# Patient Record
Sex: Male | Born: 1965 | Race: White | Hispanic: No | Marital: Single | Smoking: Never smoker
Health system: Southern US, Community
[De-identification: ages and names within clinical notes are randomized; demographics above are authoritative.]

## PROBLEM LIST (undated history)

## (undated) DIAGNOSIS — G4730 Sleep apnea, unspecified: Secondary | ICD-10-CM

## (undated) DIAGNOSIS — K219 Gastro-esophageal reflux disease without esophagitis: Secondary | ICD-10-CM

## (undated) HISTORY — DX: Sleep apnea, unspecified: G47.30

## (undated) HISTORY — DX: Gastro-esophageal reflux disease without esophagitis: K21.9

---

## 2004-08-06 ENCOUNTER — Ambulatory Visit: Payer: Self-pay | Admitting: Internal Medicine

## 2004-10-28 ENCOUNTER — Ambulatory Visit: Payer: Self-pay | Admitting: Internal Medicine

## 2005-08-06 ENCOUNTER — Ambulatory Visit: Payer: Self-pay | Admitting: Internal Medicine

## 2005-08-07 ENCOUNTER — Ambulatory Visit: Payer: Self-pay | Admitting: Internal Medicine

## 2005-08-12 ENCOUNTER — Encounter: Payer: Self-pay | Admitting: Internal Medicine

## 2005-08-12 ENCOUNTER — Ambulatory Visit (HOSPITAL_BASED_OUTPATIENT_CLINIC_OR_DEPARTMENT_OTHER): Admission: RE | Admit: 2005-08-12 | Discharge: 2005-08-12 | Payer: Self-pay | Admitting: Internal Medicine

## 2005-08-16 ENCOUNTER — Ambulatory Visit: Payer: Self-pay | Admitting: Internal Medicine

## 2005-09-25 ENCOUNTER — Ambulatory Visit: Payer: Self-pay | Admitting: Internal Medicine

## 2005-11-06 ENCOUNTER — Ambulatory Visit: Payer: Self-pay | Admitting: Internal Medicine

## 2005-12-18 ENCOUNTER — Ambulatory Visit: Payer: Self-pay | Admitting: Internal Medicine

## 2006-04-09 ENCOUNTER — Ambulatory Visit: Payer: Self-pay | Admitting: Internal Medicine

## 2006-08-06 ENCOUNTER — Ambulatory Visit: Payer: Self-pay | Admitting: Internal Medicine

## 2006-10-14 ENCOUNTER — Ambulatory Visit: Payer: Self-pay | Admitting: Internal Medicine

## 2006-11-29 ENCOUNTER — Ambulatory Visit: Payer: Self-pay | Admitting: Internal Medicine

## 2007-01-11 ENCOUNTER — Ambulatory Visit: Payer: Self-pay | Admitting: Pulmonary Disease

## 2007-06-06 ENCOUNTER — Ambulatory Visit: Payer: Self-pay | Admitting: Internal Medicine

## 2007-11-30 ENCOUNTER — Encounter: Payer: Self-pay | Admitting: Internal Medicine

## 2007-12-02 DIAGNOSIS — J302 Other seasonal allergic rhinitis: Secondary | ICD-10-CM

## 2007-12-02 DIAGNOSIS — G4733 Obstructive sleep apnea (adult) (pediatric): Secondary | ICD-10-CM | POA: Insufficient documentation

## 2007-12-02 DIAGNOSIS — J4520 Mild intermittent asthma, uncomplicated: Secondary | ICD-10-CM

## 2007-12-02 DIAGNOSIS — J3089 Other allergic rhinitis: Secondary | ICD-10-CM

## 2007-12-05 ENCOUNTER — Ambulatory Visit: Payer: Self-pay | Admitting: Internal Medicine

## 2008-06-04 ENCOUNTER — Ambulatory Visit: Payer: Self-pay | Admitting: Pulmonary Disease

## 2008-09-03 ENCOUNTER — Ambulatory Visit: Payer: Self-pay | Admitting: Internal Medicine

## 2008-12-03 ENCOUNTER — Ambulatory Visit: Payer: Self-pay | Admitting: Internal Medicine

## 2009-04-26 ENCOUNTER — Ambulatory Visit: Payer: Self-pay | Admitting: Internal Medicine

## 2009-06-10 ENCOUNTER — Ambulatory Visit: Payer: Self-pay | Admitting: Internal Medicine

## 2009-12-01 ENCOUNTER — Encounter: Payer: Self-pay | Admitting: Internal Medicine

## 2009-12-02 ENCOUNTER — Ambulatory Visit: Payer: Self-pay | Admitting: Internal Medicine

## 2009-12-02 DIAGNOSIS — K219 Gastro-esophageal reflux disease without esophagitis: Secondary | ICD-10-CM

## 2010-01-29 ENCOUNTER — Telehealth: Payer: Self-pay | Admitting: Internal Medicine

## 2010-03-18 ENCOUNTER — Encounter: Payer: Self-pay | Admitting: Internal Medicine

## 2010-05-17 ENCOUNTER — Encounter: Payer: Self-pay | Admitting: Internal Medicine

## 2010-05-29 ENCOUNTER — Encounter: Payer: Self-pay | Admitting: Internal Medicine

## 2010-06-02 ENCOUNTER — Telehealth: Payer: Self-pay | Admitting: Internal Medicine

## 2010-06-09 ENCOUNTER — Ambulatory Visit: Payer: Self-pay | Admitting: Internal Medicine

## 2010-08-26 NOTE — Assessment & Plan Note (Signed)
Summary: rov 6 months///kp   Primary Provider/Referring Provider:  Egbert Garibaldi  CC:  6 month follow up visit-sleep; no trouble with machine.blowing nose more since pressure increased.[name] [name]  History of Present Illness: 06/04/08-OSA, Asthma, Allergic rhinitis-  VPAP is at 15. Doing well. Needs new gasket for his mask. Vaccine ok. Dislikes Advair diskus and wants to try it in MDI form. Discussed and revised med list. No recent wheeze. Denies chest pain, purulent. Sleeping well, but Trazodone 50 isn't enough.  12/03/08- OSA, asthma, allergic rhinitis Continues VPAP at 15. He never turned in card. Not told he snores through it much. Denies daytime somnolence. May need knee surgery- discussed use of VPAP for surgery. Allergy - few sneezes but allergy and asthma have been well controlled. No problems with allergy vaccine but admits missing shots. We discussed compliance. discussed refill Trazodone which he takes on nights before work.  June 10, 2009- OSA, asthma, allergic rhinits Sleep- He feels he is doing well, fully compliant with his VPAP at 15, but told that he snores through at times. He attributes some daytime sleepiness to nights when he choses to stay up til 1AM when he gets up at 530AM. Trying to reduce caffeine. We discussed sleep habit and agreed to raise PAP. Allergy/ Asthma- Admits to missing allergy shots and has taken " a few" since here in May, without reaction to the shots. 15 minute allergy vaccine discusion. No asthma in over 2 years.  Dec 02, 2009- OSA, asthma, allergic rhinitis He fought off a cold 6 weeks ago with no exacerbation of his asthma.  Spring pollen  noted only for a few sneezes when outside. He continues doing well with allergy vaccine at 1:10, once monthly. He blames headache this morning on cutting down fairly heavy caffeine intake. Trazodone taken occasionally as a sleep aid.  OSA is controlled, still using his VPAP machine raised to 16/ 16. Since we raised the  pressure he has had to blow his nose frequently. He manages the humidifer ok and denies epistaxis. Girl friend tells him he snores through the VPAP.  Had barium swallow for substernal pain, told small  hiatal hernia. Omeprazole was recommended but he hasn't done it.    Current Medications (verified): 1)  Allergy Vaccine 1:10 G.o. (W-E) .... 1/ Month 2)  Vpap 16/   Drs 3)  Trazodone Hcl 50 Mg  Tabs (Trazodone Hcl) .[name] [name].. 1 To 2 For Sleep As Needed 4)  Hydrochlorothiazide 25 Mg  Tabs (Hydrochlorothiazide) .... Take 1 By Mouth Once Daily 5)  Qc Aspirin 325 Mg Tabs (Aspirin) .... Take 1 Tablet By Mouth Once A Day 6)  Simvastatin 20 Mg Tabs (Simvastatin) .... Take 1 Tablet By Mouth Once A Day 7)  Coreg 6.25 Mg Tabs (Carvedilol) .... Take 1 Tablet Two Times A Day By Mouth  Allergies (verified): No Known Drug Allergies  Past History:  Past Surgical History: Last updated: 12/05/2007 none  Family History: Last updated: 12/03/2008 Parents living- 2010 Mother- hx asthma  Social History: Last updated: 06/10/2009 Patient never smoked. 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    Vital Signs:  Patient profile:   45 year old male Height:      68 inches Weight:      340 pounds BMI:     51.88 O2 Sat:      97 % on Room air Pulse rate:   62 / minute BP sitting:   118 / 60  (left arm) Cuff size:   large  Vitals Entered By: Reynaldo Minium CMA (Dec 02, 2009 9:21  AM)  O2 Flow:  Room air  Physical Exam  Additional Exam:  General: A/Ox3; pleasant and cooperative, NAD, obese SKIN: no rash, lesions NODES: no lymphadenopathy HEENT: [place]/AT, EOM- WNL, Conjuctivae- clear, PERRLA, TM-WNL, Nose- clear, Throat- clear and wnl- Meallampati -II-III, residual tonsils NECK: Supple w/ fair ROM, JVD- none, normal carotid impulses w/o bruits Thyroid- CHEST: Clear to P&A, good airflow HEART: RRR, no m/g/r heard ABDOMEN: very obese ZOX:WRUE, nl pulses, no edema  NEURO: Grossly intact to observation      Impression & Recommendations:  Problem # 1:  SLEEP APNEA (ICD-780.57)  He can't tell that raising pressure 15 to 16 helped his apnea, but it is less comfortable with mask and facial pressure. He also worries it might be causing bluured vision, We will drop back to 15 so he can compare.  Problem # 2:  GERD (ICD-530.81)  I asked him to elevate the head of his bed, which may also help  VPAP control of his apnea.  Medications Added to Medication List This Visit: 1)  Vpap 15, Advanced  2)  Vpap 15/15 [facility]   Other Orders: Est. Patient Level III (45409) DME Referral (DME)  Patient Instructions: 1)  Please schedule a follow-up appointment in 6 months. 2)  We will contact [facility]  to have pressure reduced to 15/ 15. If this is not comfortable please let us know. 3)  Elevate the head of your bedframe with a brick under each head leg. See if this helps with reflux. You can use an acid blockefr like omeprazole if needed.

## 2010-08-26 NOTE — Assessment & Plan Note (Signed)
Summary: 6 months/apc   Primary Provider/Referring Provider:  Egbert Garibaldi  CC:  6 month follow up visit=allergies-no complaints at this time. and Hypertension Management.  History of Present Illness:  June 10, 2009- OSA, asthma, allergic rhinits Sleep- He feels he is doing well, fully compliant with his VPAP at 15, but told that he snores through at times. He attributes some daytime sleepiness to nights when he choses to stay up til 1AM when he gets up at 530AM. Trying to reduce caffeine. We discussed sleep habit and agreed to raise PAP. Allergy/ Asthma- Admits to missing allergy shots and has taken " a few" since here in May, without reaction to the shots. 15 minute allergy vaccine discusion. No asthma in over 2 years.  Dec 02, 2009- OSA, asthma, allergic rhinitis He fought off a cold 6 weeks ago with no exacerbation of his asthma.  Spring pollen  noted only for a few sneezes when outside. He continues doing well with allergy vaccine at 1:10, once monthly. He blames headache this morning on cutting down fairly heavy caffeine intake. Trazodone taken occasionally as a sleep aid.  OSA is controlled, still using his VPAP machine raised to 16/ 16. Since we raised the pressure he has had to blow his nose frequently. He manages the humidifer ok and denies epistaxis. Girl friend tells him he snores through the VPAP.  Had barium swallow for substernal pain, told small  hiatal hernia. Omeprazole was recommended but he hasn't done it.   June 09, 2010- OSA, asthma, allergic rhinitis Nurse-CC: 6 month follow up visit=allergies-no complaints at this time. Given eyedrops for macular edema. He does use his VPAP machine at 15/15. He wonders if the mask pushing on his face contributes to his eye problems . We discussed alternative masks, alternatives to VPAP. He isn't interested in any surgery. He wears mask tight. Good compliance check recently w/ good control.  Allergy vaccine continues w/o  problems.     Hypertension History:      Positive major cardiovascular risk factors include male age 42 years old or older.  Negative major cardiovascular risk factors include non-tobacco-user status.     Preventive Screening-Counseling & Management  Alcohol-Tobacco     Smoking Status: never  Current Medications (verified): 1)  Allergy Vaccine 1:10 G.o. (W-E) .... 1/ Month 2)  Vpap 15/15    [facility] 3)  Trazodone Hcl 50 Mg  Tabs (Trazodone Hcl) .Marland Kitchen.. 1 To 2 For Sleep As Needed 4)  Hydrochlorothiazide 25 Mg  Tabs (Hydrochlorothiazide) .... Take 1 By Mouth Once Daily 5)  Qc Aspirin 325 Mg Tabs (Aspirin) .... Take 1 Tablet By Mouth Once A Day 6)  Simvastatin 20 Mg Tabs (Simvastatin) .... Take 1 Tablet By Mouth Once A Day 7)  Coreg 6.25 Mg Tabs (Carvedilol) .... Take 1 Tablet Two Times A Day By Mouth 8)  Naproxen 250 Mg Tabs (Naproxen) .... 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  Risk Factors: Smoking Status: never (06/09/2010)  Vital Signs:  Patient profile:   45 year old male Height:      68 inches Weight:  344.13 pounds BMI:     52.51 O2 Sat:      97 % on Room air Pulse rate:   68 / minute BP sitting:   124 / 62  (left arm) Cuff size:   large  Vitals Entered By: Reynaldo Minium CMA (June 09, 2010 9:11 AM)  O2 Flow:  Room air CC: 6 month follow up visit=allergies-no complaints at this time., Hypertension Management   Physical Exam  Additional Exam:  General: A/Ox3; pleasant and cooperative, NAD, obese SKIN: no rash,  lesions NODES: no lymphadenopathy HEENT: [place]/AT, EOM- WNL, Conjuctivae- clear, PERRLA, TM-WNL, Nose- clear, Throat- clear and wnl- Mallampati -II-III, residual tonsils NECK: Supple w/ fair ROM, JVD- none, normal carotid impulses w/o bruits Thyroid- CHEST: Clear to P&A, good airflow HEART: RRR, no m/g/r heard ABDOMEN: very obese ZOX:WRUE, nl pulses, no edema  NEURO: Grossly intact to observation      Impression & Recommendations:  Problem # 1:  SLEEP APNEA (ICD-780.57)  His compliance is good. I don't know that his mask has anything to do with his macular edema, but the pressures might.  We will turn his pressures downt to 14. We will give script for replacement mask so he can take it to different DME companies to compare mask models. We will get him across to the Sleep Center daytime staff to get help with CPAP comfort issues.   Problem # 2:  ALLERGIC RHINITIS (ICD-477.9) adequate control for the season.   Medications Added to Medication List This Visit: 1)  Replacement Cpap Mask of Choice and Supplies  .... Dx osa 780.57 2)  Naproxen 250 Mg Tabs (Naproxen) .... Use as directed per bottle as needed  Other Orders: Est. Patient Level IV (45409) DME Referral (DME)  Hypertension Assessment/Plan:      The patient's hypertensive risk group is category B: At least one risk factor (excluding diabetes) with no target organ damage.  Today's blood pressure is 124/62.     Patient Instructions: 1)  Please schedule a follow-up appointment in 6 months. 2)  We will try reducing VPAP pressures to 14/14 to see if that takes any fluid pressure off your eye. 3)  See [facility] for names and addresses of some other DME companies that might have different mask designs. She can also get you over to see the [facility] staff for help with mask fit and comfort.  4)  ...................................................................................................... 5)  ii/15/11- He did go to [facility]  and mask adjusted to pressure 14 CPAP w/ medium Phillips Comfort full face mask

## 2010-08-26 NOTE — Progress Notes (Signed)
Summary: CPAP/ VPAP download good on current settings.   Phone Note Other Incoming   Summary of Call: [facility]- Complaince donwload on VPAP insp 15/ exp 15- good compliance and control . No change needed.  Initial call taken by: Waymon Budge MD,  June 02, 2010 5:30 PM

## 2010-08-26 NOTE — Letter (Signed)
Summary: CMN for Replmnt full face cush/[facility]  CMN for Replmnt full face cush/[facility]   Imported By: Sherian Rein 05/26/2010 13:46:13  _____________________________________________________________________  External Attachment:    Type:   Image     Comment:   External Document

## 2010-08-26 NOTE — Letter (Signed)
Summary: CPAP Supplies/[facility]  CPAP Supplies/[facility]   Imported By: Sherian Rein 03/21/2010 11:49:33  _____________________________________________________________________  External Attachment:    Type:   Image     Comment:   External Document

## 2010-08-26 NOTE — Progress Notes (Signed)
Summary: order  Phone Note Call from Patient Call back at Home Phone [phone]   Caller: Patient Call For: youing Reason for Call: Talk to Nurse Summary of Call: pt switching DME companies.  Need order and sleep study sent to [facility] in [place].  [phone] Fax# Initial call taken by: Eugene Gavia,  January 29, 2010 4:13 PM  Follow-up for Phone Call        called spoke with patient who states he was supposed to have been changed from drs to [facility] in [place] at last ov in may, but this has not been done.  i see an order in patient's chart where vpap pressure was sent to [facility]; they will also need a copy of his sleep study and recent download per pt.  will send to [facility] Boone Master CNA/MA  January 29, 2010 4:20 PM   Called and spoke with [facility] in [place]. Order was faxed to them in May along with sleep study and Beth stated that she did not recv. order. Refaxed order to  [phone] with sleep study and asked that they process ASAP. Rhonda Cobb  January 30, 2010 11:25 AM Contacted pt and advised pt that order and sleep study has been re-faxed to [facility] in [place]. Asked that [facility] call pt today. Pt advised that if he does not hear from them in a week to call us back. Pt stated that he would. Rhonda Cobb  January 30, 2010 11:39 AM

## 2010-11-26 ENCOUNTER — Encounter: Payer: Self-pay | Admitting: Internal Medicine

## 2010-12-01 ENCOUNTER — Ambulatory Visit (INDEPENDENT_AMBULATORY_CARE_PROVIDER_SITE_OTHER): Payer: Federal, State, Local not specified - PPO | Admitting: Internal Medicine

## 2010-12-01 ENCOUNTER — Encounter: Payer: Self-pay | Admitting: Internal Medicine

## 2010-12-01 VITALS — BP 140/78 | HR 73 | Ht 68.0 in | Wt 339.8 lb

## 2010-12-01 DIAGNOSIS — J45909 Unspecified asthma, uncomplicated: Secondary | ICD-10-CM

## 2010-12-01 DIAGNOSIS — G4730 Sleep apnea, unspecified: Secondary | ICD-10-CM

## 2010-12-01 DIAGNOSIS — J309 Allergic rhinitis, unspecified: Secondary | ICD-10-CM

## 2010-12-01 MED ORDER — TRAZODONE HCL 50 MG PO TABS: 50.0000 mg | ORAL_TABLET | Freq: Every day | ORAL | Status: DC

## 2010-12-01 NOTE — Progress Notes (Signed)
  Subjective:    Patient ID: [name] [name], male    DOB: 1966-04-26, 45 y.o.   MRN: [id number]  HPI 12/01/10-45 yoM followed for sleep apnea and allergic rhinitis, complicated by morbid obesity and hx of GERD.  Last here June 09, 2010- note reviewed.  He did well through the winter. He got new cat, and has been outside on porch with old carpet, playing with the cat. In last 2 days has had more nasal congestion and drainage.  Has zyrtec but hadn't taken it. Denies cough, wheeze, chest tightness.  Continues allergy vaccine.  Continues VPAP at 15/15, able to use it all night, every night. Has not lost weight.  Review of Systems See HPI Constitutional:   No weight loss, night sweats,  Fevers, chills, fatigue, lassitude. HEENT:   No headaches,  Difficulty swallowing,  Tooth/dental problems,  Sore throat,                CV:  No chest pain,  Orthopnea, PND, swelling in lower extremities, anasarca, dizziness, palpitations  GI  No heartburn, indigestion, abdominal pain, nausea, vomiting, diarrhea, change in bowel habits, loss of appetite  Resp: No excess mucus, no productive cough,  No non-productive cough,  No coughing up of blood.  No change in color of mucus.  No wheezing.  No chest wall deformity  Skin: no rash or lesions.  GU: no dysuria, change in color of urine, no urgency or frequency.  No flank pain.  MS:  No joint pain or swelling.  No decreased range of motion.  No back pain.  Psych:  No change in mood or affect. No depression or anxiety.  No memory loss.      Objective:   Physical Exam General- Alert, Oriented, Affect-appropriate, Distress- none acute     Morbidly obese  Skin- rash-none, lesions- none, excoriation- none  Lymphadenopathy- none  Head- atraumatic  Eyes- Gross vision intact, PERRLA, conjunctivae clear secretions  Ears- Normal - Hearing, canals, Tm  Nose- Clear,  No- Septal dev, mucus, polyps, erosion, perforation   Throat- Mallampati IV , mucosa clear ,  drainage- none, tonsils- atrophic  Neck- flexible , trachea midline, no stridor , thyroid nl, carotid no bruit  Chest - symmetrical excursion , unlabored     Heart/CV- RRR , no murmur , no gallop  , no rub, nl s1 s2                     - JVD- none , edema- none, stasis changes- none, varices- none     Lung- clear to P&A, wheeze- none, cough- none , dullness-none, rub- none     Chest wall-  Abd- tender-no, distended-no, bowel sounds-present, HSM- no  Br/ Gen/ Rectal- Not done, not indicated  Extrem- cyanosis- none, clubbing, none, atrophy- none, strength- nl  Neuro- grossly intact to observation         Assessment & Plan:

## 2010-12-01 NOTE — Patient Instructions (Signed)
Continue present treatments as discussed. Please call if needed.

## 2010-12-01 NOTE — Assessment & Plan Note (Signed)
Multiple recent exposures as discussed. No acute intervention available that would make sense for him now. Med education done.

## 2010-12-01 NOTE — Assessment & Plan Note (Signed)
He met with sleep center day-time staff, without mask change. He is now able to use VPAP every night and compliance is very much better.  He is well aware that weight loss would help.[name] [name]

## 2010-12-07 ENCOUNTER — Encounter: Payer: Self-pay | Admitting: Internal Medicine

## 2010-12-07 NOTE — Assessment & Plan Note (Signed)
No recent exacerbation 

## 2010-12-09 NOTE — Assessment & Plan Note (Signed)
[facility]                             PULMONARY OFFICE NOTE   NAME:[name] [name], [name] [name]                          MRN:          [id number]  DATE:01/11/2007                            DOB:          April 01, 1966    HISTORY OF PRESENT ILLNESS:  This is a 45 year old white male patient of  Dr. Roxy Cedar who has a known history of asthma, allergic rhinitis,  obstructive sleep apnea and morbid obesity, presents today for an acute  office visit.  The patient claims over the last 2 weeks he has had  increased nasal congestion, cough, postnasal drip and wheezing.  The  patient denies any purulent sputum, hemoptysis, orthopnea, PND or  increased leg swelling.  The patient was given ipratropium and nasal  spray and Hydromet cough syrup by his primary care physician and has had  only slight improvement in his symptoms.   PAST MEDICAL HISTORY:  Is reviewed.   CURRENT MEDICATIONS:  Reviewed.   PHYSICAL EXAMINATION:  The patient is a pleasant male in no acute  distress.  He is afebrile with stable vital signs.  O2 saturation is 96% on room  air.  HEENT:  Nasal mucosa is pale.  Nontender sinuses.  Posterior pharynx is  clear.  NECK:  Supple without cervical adenopathy.  No JVD.  LUNGS:  Sounds reveal diminished breath sounds without any wheezing or  crackles.  CARDIAC:  Regular rate and rhythm.  ABDOMEN:  Morbidly obese and soft.  EXTREMITIES:  Warm without any cyanosis, or clubbing.  There appears to  be trace to 1+ edema.  Patient has bilateral support stockings on.   IMPRESSION AND PLAN:  Acute upper respiratory infection and rhinitis  flare.  The patient is to begin Mucinex DM twice daily.  Add in Zyrtec  10 mg at bedtime.  Was given his Xopenex nebulizer treatment in the  office.  The patient will follow back up with Dr. Maple Hudson as scheduled or  sooner if needed.  The patient is to contact our office for sooner  followup if symptoms do not improve or  worsen.      Rubye Oaks, NP  Electronically Signed      Clinton D. Maple Hudson, MD, Tonny Bollman, FACP  Electronically Signed   TP/MedQ  DD: 01/11/2007  DT: 01/12/2007  Job #: [id number]

## 2010-12-09 NOTE — Assessment & Plan Note (Signed)
[facility]                             PULMONARY OFFICE NOTE   NAME:[name], [name]                          MRN:          [id number]  DATE:06/06/2007                            DOB:          April 04, 1966    PROBLEM:  1. Asthma.  2. Allergic rhinitis.  3. Obstructive sleep apnea.  4. Exogenous obesity.   HISTORY:  Was seen here for acute flare of asthma with a viral syndrome  in June and subsequently got an Avelox prescription elsewhere because he  was a little anxious about his status just before a trip to [place].  Today he feels well.  We discussed refilling Trazodone which he got  filled in January and uses only occasionally for sleep.  BiPAP is set at  inspiratory 13 and expiratory 11.  Sometimes he feels that it is not  quite high enough when he is lying on his back with a sense of closing  off at the lower throat.  He has been inconsistent with his allergy  vaccine and we talked through this.  He believes it helps and wants to  stay with it.  There have been no reactions.   MEDICATIONS:  1. Allergy vaccine..  2. BiPAP.  3. Amlodipine 5 mg.  4. P.r.n. use of Advair 250/50.  5. Ipratropium 0.06% nasal spray.  6. Hydromet cough syrup.  7. Trazodone 50 mg one-half or one bedtime p.r.n. sleep.  8. EpiPen.   NO MEDICATION ALLERGY.   OBJECTIVE:  Overweight at 345 pounds, blood pressure 152/80, pulse 76,  room air saturation 98%.  He is alert and calm, nasal airway  unobstructed.  CHEST:  Clear.  HEART:  Sounds normal.   IMPRESSION:  1. Allergic rhinitis, asthma, currently under satisfactory control.      We discussed allergy vaccine.  He chooses to continue and we have      discussed logistics and management to keep him on schedule.  2. Obstructive sleep apnea.  Losing some control because of weight      gain.  He is encouraged      to lose weight and DRS is contacted to raise his biphasic positive      airway pressure to inspiratory 14  and expiratory 11.  Schedule      return 6 months, earlier p.r.n.     Clinton D. Maple Hudson, MD, Tonny Bollman, FACP  Electronically Signed    CDY/MedQ  DD: 06/06/2007  DT: 06/06/2007  Job #: [id number]   cc:   Cheri Rous

## 2010-12-12 NOTE — Procedures (Signed)
NAMEDILLION, STOWERS NO.:  [id number]   MEDICAL RECORD NO.:  [id number]          PATIENT TYPE:  OUT   LOCATION:  SLEEP CENTER                 FACILITY:  [facility]   PHYSICIAN:  Clinton D. Maple Hudson, M.D. DATE OF BIRTH:  November 10, 1965   DATE OF STUDY:  08/12/2005                              NOCTURNAL POLYSOMNOGRAM   REFERRING PHYSICIAN:  Dr. Jetty Duhamel   DATE OF STUDY:  August 12, 2005   INDICATION FOR STUDY:  Hypersomnia with sleep apnea. Epworth sleepiness  score 11/24, BMI 48.8, weight 330 pounds.   SLEEP ARCHITECTURE:  Short total sleep time 221 minutes with sleep  efficiency 56%. Stage I was 26%, stage II 65%, stages III and IV 4%, REM 5%  of total sleep time. Sleep latency 51 minutes, REM latency 288 minutes,  awake after sleep onset 119 minutes, arousal index markedly increased at 83  per hour. No bedtime medication was taken.   RESPIRATORY DATA:  Apnea-hypopnea index (AHI, RDI) 53.9 obstructive events  per hour indicating severe obstructive sleep apnea/hypopnea syndrome. This  included 2 central apneas, 112 obstructive apneas and 85 hypopneas. Events  were not positional. REM AHI 97 per hour. He was unable to maintain sleep  sufficiently to permit use of CPAP titration by split protocol on this study  night.   OXYGEN DATA:  Moderate to severe snoring with oxygen desaturation to a nadir  of 79%. Mean oxygen saturation through the study was 92% on room air.   CARDIAC DATA:  Normal sinus rhythm.   MOVEMENT/PARASOMNIA:  A total of 41 limb jerks were recorded of which 17  were associated with arousal or awakening for a periodic limb movement with  arousal index of 4.6 per hour which is increased. The patient complained of  back pain in the morning. Bathroom x2.   IMPRESSION/RECOMMENDATIONS:  1.  Severe obstructive sleep apnea/hypopnea syndrome, apnea-hypopnea index      53.9 per hour with nonpositional events, moderate to loud snoring and      oxygen  desaturation to 79%.  2.  Restless sleep quality with frequent awakenings attributed at least in      part to the patient's desire to remain sleeping prone. Note complaint of      back pain the next day.  3.  Consider return for continuous positive airway pressure titration or      evaluate for alternative therapies as appropriate.      Clinton D. Maple Hudson, M.D.  Diplomate, Biomedical engineer of Sleep Medicine  Electronically Signed     CDY/MEDQ  D:  08/16/2005 13:28:53  T:  08/17/2005 18:49:52  Job:  [id number]

## 2010-12-12 NOTE — Assessment & Plan Note (Signed)
[facility]                             PULMONARY OFFICE NOTE   NAME:[name], [name]                          MRN:          [id number]  DATE:10/14/2006                            DOB:          [birth year]    PROBLEM:  1. Asthma.  2. Allergic rhinitis.  3. Obstructive sleep apnea.  4. Exogenous obesity.   HISTORY:  He describes an episode of palpitation before sleep about a  week ago, felt as a rapid heartbeat lasting about 3 hours, and self-  limited.  He did not notice whether it began abruptly or gradually, and  there was no chest pain.  He says a doctor friend gave him Azmacort and  Cipro, which had helped some chest congestion until he got near a  burning field yesterday, and now he is coughing more.  He has had no  sore throat, fever, or sinus pain.  He says his appetite is down without  nausea or vomiting.  In mid January he had gone to [facility]  on a weekend, and got a nebulizer treatment.  Apparently, there was  discussion about whether he was mostly anxious.  He is using his BiPAP  routinely now, and seems to be better settled in with that.  He  continues allergy vaccine at 1:10 with no problems.  BiPAP is set at  inspiratory 13, expiratory 11.   Also on:  1. Amlodipine 5 mg.  2. He has a sample of Azmacort.  3. He has had some Ambien CR 12.5 mg.   NO MEDICATION ALLERGY.   OBJECTIVE:  Weight was not recorded.  I think he is off our scale now.  BP 132/82.  Pulse 101, regular.  Oxygen saturation 97% at rest.  He is quite obese.  Pulse was regular with no extra beats.  No murmur or gallop.  There is minimal chest congestion bilaterally.  Unlabored.  Legs were heavy, but soft without edema.   IMPRESSION:  1. Bronchitis.  2. Paroxysmal tachy rhythm.   PLAN:  1. Finish the Cipro he was given.  2. Nebulizer treatment here.  Xopenex 1.25 mg.  3. Finish sample of Azmacort at 2 puffs t.i.d.  We want to reevaluate      when he is  off that.  4. Consider a peak flow meter.  5. He is going to keep his May appointment, earlier p.r.n.     Clinton D. Maple Hudson, MD, Tonny Bollman, FACP  Electronically Signed    CDY/MedQ  DD: 10/16/2006  DT: 10/16/2006  Job #: [id number]   cc:   Cheri Rous

## 2010-12-12 NOTE — Assessment & Plan Note (Signed)
[facility]                               PULMONARY OFFICE NOTE   NAME:[name] [name]                          MRN:          [id number]  DATE:04/09/2006                            DOB:          [birth year]    PULMONARY SLEEP FOLLOW-UP   PROBLEM:  1. Asthma.  2. Allergic rhinitis.  3. Obstructive sleep apnea.  4. Exogenous obesity.   HISTORY:  [name] [name] says [name] [name] is comfortable with no problems at all on allergy  vaccine at 1:10, giving his own injections.  We reviewed policy concerning  administration outside of a medical office, anaphylaxis, epinephrine, and  options.  [name] [name] reviewed and signed our waiver, choosing to continue giving his  own vaccine, which [name] [name] has tolerated well.  [name] [name] denies any respiratory  problems this summer or fall so far.  [name] [name] has traveled back and forth to [facility] trying to get his BiPAP comfortable.  [name] [name] had originally been set  at 10/6 and then was auto-titrated up to 19/16 with flex at 3.  Was not  comfortable with that and we adjusted first to 16/14 with a flex at 3.  [name] [name]  now feels the pressure is too high.  We discussed establishing with a home  care company closer to him.  We have again discussed weight loss, which  would help a great deal, and other treatment options available.   MEDICATIONS:  1. Allergy vaccine.  2. BiPAP.  3. Ambien CR 12.5, used occasionally.   NO MEDICATION ALLERGY.   OBJECTIVE:  VITAL SIGNS:  Weight 340 pounds, which is down a little bit.  Bp  142/86, pulse regular 74, room air saturation 90%.  RESPIRATORY:  Breathing is unlabored at rest.  [name] [name] is quite alert.  There are  no pressure marks on his face from the mask, and no evident nasal  congestion, neck vein distension, or stridor.  HEART:  Sounds are regular without murmur.   IMPRESSION:  1. Allergic rhinitis and mild asthma seem well-controlled.  2. Obstructive sleep apnea remains a comfort problem.  The controlling      issue is his  inability to lose weight, and [name] [name] may have actually become      interested in bariatric surgery if other measures do not work.  We will      keep trying to help him get comfortable with BiPAP.   PLAN:  1. DRS is going to reduce his BiPAP to inspiratory 13 and expiratory 11.  2. [name] [name] has a current prescription for an EpiPen to keep on hand, as [name] [name]      continues allergy      vaccine at 1:10.  3. Schedule return in 4 months, earlier p.r.n.                                   Clinton D. Maple Hudson, MD, FCCP, FACP   CDY/MedQ  DD:  04/10/2006  DT:  04/11/2006  Job #:  [id number]   cc:  Cheri Rous

## 2010-12-12 NOTE — Assessment & Plan Note (Signed)
[facility]                             PULMONARY OFFICE NOTE   NAME:[name] [name]                          MRN:          [id number]  DATE:08/06/2006                            DOB:          1965-11-06    PULMONARY/SLEEP FOLLOWUP   PROBLEM:  1. Asthma.  2. Allergic rhinitis.  3. Obstructive sleep apnea.  4. Exogenous obesity.   HISTORY:  He had been very frustrated with BiPAP fit and comfort.  We  gave him an opportunity to switch home care companies but he did not act  on that and stopped using his mask for quite awhile.  Then, in recent  weeks, he has had 2 or 3 episodes where he has felt 1 or 2 distinct  palpitations which he interprets as my heart stopped for not more than  1 or 2 beats at a time with no syncope, chest pain, or distress  otherwise.  He decided this was a sign that he did need to be compliant  with his BiPAP.  He still feels the pressure is a little too high set  with an inspiratory pressure of 13 and an expiratory pressure of 11.  He  had gone with a tourist group to [place] and caught a cold there.  He is  taking an antibiotic, getting over that.  He is able to tolerate nasal  pillows instead of a full-face CPAP mask and apparently this is  acceptable even with the cold.  He is expressing more motivation towards  trying to lose weight and change his lifestyle and I offered support.   MEDICATIONS:  1. Allergy vaccine continues at 1:10 with no problems.  2. BiPAP has been inspiratory 13, expiratory 11.  3. Amlodipine 5 mg.  4. Amoxicillin currently.  5. Ambien CR 12.5 mg for he asks for a refill.   No medication allergy.   OBJECTIVE:  Weight 345 pounds.  BP 142/72.  Pulse 77.  Room air  saturation 96%.  Pulse is quite regular during prolonged palpation by  me.  Heart sounds are normal.  There is no neck vein distention or stridor.  LUNGS:  Clear. No edema.  Nasal airway is clear.  No distress.   IMPRESSION:  Sleep apnea  with persistent difficulty with continuous  positive airway pressure tolerance.  We are going to try a lower  pressure.  Recent palpitations probably are not related to his sleep  apnea.  Exogenous obesity remains the major underlying problem.   PLAN:  1. DRS will reduce his inspiratory pressure to 12, continuing pressure      at 11 for expiration.  2. Weight loss.  3. He will followup with his primary physician about his palpitation.  4. Refill Ambien CR 12.5 mg for occasional p.r.n. use.  5. Schedule return in 4 months, earlier p.r.n.     Clinton D. Maple Hudson, MD, Tonny Bollman, FACP  Electronically Signed    CDY/MedQ  DD: 08/06/2006  DT: 08/06/2006  Job #: [id number]   cc:   Cheri Rous

## 2010-12-12 NOTE — Assessment & Plan Note (Signed)
[facility]                             PULMONARY OFFICE NOTE   NAME:[name] [name], [name] [name]                          MRN:          [id number]  DATE:11/29/2006                            DOB:          1966/01/02    PROBLEM LIST:  1. Asthma.  2. Allergic rhinitis.  3. Obstructive sleep apnea.  4. Exogenous obesity.   HISTORY:  He says pollen made his skin itch when he was mowing the lawn  and he realized he should be wearing a mask. His primary care physician  gave him a prednisone burst and some Benadryl. I am not clear if he just  itched or actually was beginning to get some rash from this but it has  resolved. He did a CPAP download with his home care company and that is  pending their report to me. He is using CPAP now virtually all night  every night and seems to be a lot happier with it.   MEDICATIONS:  1. He continues the allergy vaccine without problems. He has an Epipen      which has not been needed. BiPAP is inspiratory 13, expiratory 11      at last check.  2. Amlodipine 5 mg.  3. Occasional Ambien CR 12.5 mg.   No medication allergies.   OBJECTIVE:  VITAL SIGNS:  Weight 330 pounds which is down a couple.  Blood pressure 130/67, pulse 70, room air saturation 97%.  LUNGS:  Clear.  HEART:  Sounds normal.  There are no pressure marks on his face. His nose is not obstructed.   IMPRESSION:  1. Allergic rhinitis with recent dermatitis by description, now      resolved.  2. Asthma.  3. Obstructive sleep apnea. The best thing that could possibly happen      would be for continued effort at weight loss.   PLAN:  We discussed options. Schedule return in 6 months, earlier p.r.n.    Clinton D. Maple Hudson, MD, Tonny Bollman, FACP  Electronically Signed   CDY/MedQ  DD: 11/29/2006  DT: 11/30/2006  Job #: [id number]   cc:   Cheri Rous

## 2011-06-01 ENCOUNTER — Encounter: Payer: Self-pay | Admitting: Internal Medicine

## 2011-06-01 ENCOUNTER — Ambulatory Visit (INDEPENDENT_AMBULATORY_CARE_PROVIDER_SITE_OTHER): Payer: Federal, State, Local not specified - PPO | Admitting: Internal Medicine

## 2011-06-01 VITALS — BP 108/62 | HR 65 | Ht 68.0 in | Wt 340.0 lb

## 2011-06-01 DIAGNOSIS — J309 Allergic rhinitis, unspecified: Secondary | ICD-10-CM

## 2011-06-01 DIAGNOSIS — G4730 Sleep apnea, unspecified: Secondary | ICD-10-CM

## 2011-06-01 NOTE — Progress Notes (Signed)
Subjective:    Patient ID: [name] [name], male    DOB: 1965/08/27, 45 y.o.   MRN: [id number]  HPI 12/01/10-45 yoM followed for sleep apnea and allergic rhinitis, complicated by morbid obesity and hx of GERD.  Last here June 09, 2010- note reviewed.  He did well through the winter. He got new cat, and has been outside on porch with old carpet, playing with the cat. In last 2 days has had more nasal congestion and drainage.  Has zyrtec but hadn't taken it. Denies cough, wheeze, chest tightness.  Continues allergy vaccine.  Continues VPAP at 15/15, able to use it all night, every night. Has not lost weight.  06/01/11- 45 yoM followed for sleep apnea and allergic rhinitis, complicated by morbid obesity and hx of GERD.  He declines flu vaccine. Doing well. Download documents excellent compliance and control with VPAP at inspiratory pressure 14 and expiratory pressure 14. He has had some problem with the quality and durability of his mask and hose. We talked about alternative suppliers. He got a cat in April. In recent months he has had increased throat clearing and dry cough. These wake him occasionally and are relieved by sips or hard candy. He is not really aware of reflux. We discussed the potential role of lisinopril as an ACE inhibitor.   Review of Systems See HPI Constitutional:   No-   weight loss, night sweats, fevers, chills, fatigue, lassitude. HEENT:   No-  headaches, difficulty swallowing, tooth/dental problems, sore throat,       No-  sneezing, itching, ear ache, nasal congestion, post nasal drip,  CV:  No-   chest pain, orthopnea, PND, swelling in lower extremities, anasarca, dizziness, palpitations Resp: No-   shortness of breath with exertion or at rest.              No-   productive cough,  +non-productive cough,  No- coughing up of blood.              No-   change in color of mucus.  No- wheezing.   Skin: No-   rash or lesions. GI:  No-   heartburn, indigestion, abdominal pain,  nausea, vomiting, diarrhea,                 change in bowel habits, loss of appetite GU: No-   dysuria, change in color of urine, no urgency or frequency.  No- flank pain. MS:  No-   joint pain or swelling.  No- decreased range of motion.  No- back pain. Neuro-     nothing unusual Psych:  No- change in mood or affect. No depression or anxiety.  No memory loss.      Objective:   Physical Exam General- Alert, Oriented, Affect-appropriate, Distress- none acute, morbidly obese Skin- rash-none, lesions- none, excoriation- none Lymphadenopathy- none Head- atraumatic            Eyes- Gross vision intact, PERRLA, conjunctivae clear secretions            Ears- Hearing, canals-normal            Nose- Clear, no-Septal dev, mucus, polyps, erosion, perforation             Throat- Mallampati III-IV , mucosa obscured, drainage- none, tonsils- atrophic Neck- flexible , trachea midline, no stridor , thyroid nl, carotid no bruit Chest - symmetrical excursion , unlabored           Heart/CV- RRR , no murmur , no gallop  ,  no rub, nl s1 s2                           - JVD- none , edema- none, stasis changes- none, varices- none           Lung- clear to P&A, wheeze- none, cough- none , dullness-none, rub- none           Chest wall-  Abd- tender-no, distended-no, bowel sounds-present, HSM- no Br/ Gen/ Rectal- Not done, not indicated Extrem- cyanosis- none, clubbing, none, atrophy- none, strength- nl Neuro- grossly intact to observation

## 2011-06-01 NOTE — Patient Instructions (Signed)
Consider looking at [employer] as an alternative supplier of CPAP supplies  Ask your primary physician if you can either come off Lisinopril, or replace it with something else for a month, to see if your cough is related to the "ACE" class of BP meds. Also watch for any hints that cough is related to postnasal drip or to reflux off stomach juice.

## 2011-06-02 NOTE — Assessment & Plan Note (Signed)
He continues allergy vaccine. He has had a cat now for 7 months and he is responsible for caring for it. We discussed his throat symptoms as either allergic or possibly caused by his ACE inhibitor. He is going to discuss a trial off of lisinopril with his primary physician.

## 2011-06-02 NOTE — Assessment & Plan Note (Signed)
Good compliance and control as of download 06/01/2011. Weight loss would help. Obesity hypoventilation syndrome is not ruled out. We discussed the expectations for durability of his equipment and typical time intervals at which insurers will provide replacement.

## 2011-11-30 ENCOUNTER — Encounter: Payer: Self-pay | Admitting: Internal Medicine

## 2011-11-30 ENCOUNTER — Ambulatory Visit (INDEPENDENT_AMBULATORY_CARE_PROVIDER_SITE_OTHER): Payer: Federal, State, Local not specified - PPO | Admitting: Internal Medicine

## 2011-11-30 VITALS — BP 120/64 | HR 73 | Ht 68.0 in | Wt 347.2 lb

## 2011-11-30 DIAGNOSIS — G4730 Sleep apnea, unspecified: Secondary | ICD-10-CM

## 2011-11-30 DIAGNOSIS — J45909 Unspecified asthma, uncomplicated: Secondary | ICD-10-CM

## 2011-11-30 DIAGNOSIS — J309 Allergic rhinitis, unspecified: Secondary | ICD-10-CM

## 2011-11-30 NOTE — Progress Notes (Signed)
Subjective:    Patient ID: [name] [name], male    DOB: 1966-05-16, 46 y.o.   MRN: [id number]  HPI 12/01/10-45 yoM followed for sleep apnea and allergic rhinitis, complicated by morbid obesity and hx of GERD.  Last here June 09, 2010- note reviewed.  He did well through the winter. He got new cat, and has been outside on porch with old carpet, playing with the cat. In last 2 days has had more nasal congestion and drainage.  Has zyrtec but hadn't taken it. Denies cough, wheeze, chest tightness.  Continues allergy vaccine.  Continues VPAP at 15/15, able to use it all night, every night. Has not lost weight.  06/01/11- 45 yoM followed for sleep apnea and allergic rhinitis, complicated by morbid obesity and hx of GERD.  He declines flu vaccine. Doing well. Download documents excellent compliance and control with VPAP at inspiratory pressure 14 and expiratory pressure 14. He has had some problem with the quality and durability of his mask and hose. We talked about alternative suppliers. He got a cat in April. In recent months he has had increased throat clearing and dry cough. These wake him occasionally and are relieved by sips or hard candy. He is not really aware of reflux. We discussed the potential role of lisinopril as an ACE inhibitor.  11/30/11- 45 yoM followed for sleep apnea and allergic rhinitis, complicated by morbid obesity and hx of GERD. Using VPAP every night for approx5-6 hours at night and pressure doing well; States he would like to speak about allergy vaccine-as his has expired-cant tell any difference since being off vaccne He continues VPAP 14/14/[employer] [facility] with documented good compliance and control. He is concerned that the mask pushes up under his orbits and might possibly worsen his astigmatism. He tried many different masks before settling on this when. We discussed alternatives to CPAP including oral appliances. He drifted off of allergy vaccine again, some months ago.  Was not very uncomfortable through this pollen season. I suggested we stay off vaccine and watch to see how he does. He will use antihistamines.   Review of Systems-See HPI Constitutional:   No-   weight loss, night sweats, fevers, chills, fatigue, lassitude. HEENT:   No-  headaches, difficulty swallowing, tooth/dental problems, sore throat,       No-  sneezing, itching, ear ache, nasal congestion, post nasal drip,  CV:  No-   chest pain, orthopnea, PND, swelling in lower extremities, anasarca, dizziness, palpitations Resp: No-   shortness of breath with exertion or at rest.              No-   productive cough,  +non-productive cough,  No- coughing up of blood.              No-   change in color of mucus.  No- wheezing.   Skin: No-   rash or lesions. GI:  No-   heartburn, indigestion, abdominal pain, nausea, vomiting,  GU:  MS:   Neuro-     nothing unusual Psych:  No- change in mood or affect. No depression or anxiety.  No memory loss.  Objective:   Physical Exam General- Alert, Oriented, Affect-appropriate, Distress- none acute, morbidly obese Skin- rash-none, lesions- none, excoriation- none Lymphadenopathy- none Head- atraumatic            Eyes- Gross vision intact, PERRLA, conjunctivae clear secretions            Ears- Hearing, canals-normal  Nose- Clear, no-Septal dev, mucus, polyps, erosion, perforation             Throat- Mallampati III-IV , mucosa obscured, drainage- none, tonsils- atrophic Neck- flexible , trachea midline, no stridor , thyroid nl, carotid no bruit Chest - symmetrical excursion , unlabored           Heart/CV- RRR , no murmur , no gallop  , no rub, nl s1 s2                           - JVD- none , edema- none, stasis changes- none, varices- none           Lung- clear to P&A, wheeze- none, cough- none , dullness-none, rub- none           Chest wall-  Abd-  Br/ Gen/ Rectal- Not done, not indicated Extrem- cyanosis- none, clubbing, none, atrophy- none,  strength- nl Neuro- grossly intact to observation

## 2011-11-30 NOTE — Patient Instructions (Addendum)
My suggestion, since you have been off allergy vaccine for a while- This may be a good time to stop allergy shots and see how you do off them. I will tell the allergy lab. We can always restart if needed.   As an alternative to VPAP, you can look into oral appliances to treat snoring and sleep apnea.  Suggest you call the office of Dr Althea Grimmer, DDS, Orthodontist in [place], for information on these devices.

## 2011-12-04 NOTE — Assessment & Plan Note (Signed)
Very mild intermittent, not needing medication now.

## 2011-12-04 NOTE — Assessment & Plan Note (Signed)
Good compliance and control with VPAP but he is getting restless and would like to know alternatives. I have again suggested he talk with Dr. Althea Grimmer about oral appliances. Weight loss remains an important issue.

## 2011-12-04 NOTE — Assessment & Plan Note (Signed)
He has drifted off of allergy vaccine again and is not sufficiently compliant to benefit. He has done well this spring. The real test will be over the next year. I am stopping his allergy vaccine now for observation.

## 2012-03-29 ENCOUNTER — Ambulatory Visit (INDEPENDENT_AMBULATORY_CARE_PROVIDER_SITE_OTHER): Payer: Federal, State, Local not specified - PPO | Admitting: Internal Medicine

## 2012-03-29 ENCOUNTER — Encounter: Payer: Self-pay | Admitting: Internal Medicine

## 2012-03-29 VITALS — BP 142/64 | HR 64 | Ht 68.0 in | Wt 344.8 lb

## 2012-03-29 DIAGNOSIS — J309 Allergic rhinitis, unspecified: Secondary | ICD-10-CM

## 2012-03-29 DIAGNOSIS — J45909 Unspecified asthma, uncomplicated: Secondary | ICD-10-CM

## 2012-03-29 DIAGNOSIS — G4730 Sleep apnea, unspecified: Secondary | ICD-10-CM

## 2012-03-29 NOTE — Patient Instructions (Addendum)
I hope your experience is good with the oral appliance for your sleep apnea. I will be interested in hearing.  Please call as needed.

## 2012-03-29 NOTE — Progress Notes (Signed)
Subjective:    Patient ID: [name] [name], male    DOB: [birth year], 46 y.o.   MRN: [id number]  HPI 12/01/10-45 yoM followed for sleep apnea and allergic rhinitis, complicated by morbid obesity and hx of GERD.  Last here June 09, 2010- note reviewed.  He did well through the winter. He got new cat, and has been outside on porch with old carpet, playing with the cat. In last 2 days has had more nasal congestion and drainage.  Has zyrtec but hadn't taken it. Denies cough, wheeze, chest tightness.  Continues allergy vaccine.  Continues VPAP at 15/15, able to use it all night, every night. Has not lost weight.  06/01/11- 45 yoM followed for sleep apnea and allergic rhinitis, complicated by morbid obesity and hx of GERD.  He declines flu vaccine. Doing well. Download documents excellent compliance and control with VPAP at inspiratory pressure 14 and expiratory pressure 14. He has had some problem with the quality and durability of his mask and hose. We talked about alternative suppliers. He got a cat in April. In recent months he has had increased throat clearing and dry cough. These wake him occasionally and are relieved by sips or hard candy. He is not really aware of reflux. We discussed the potential role of lisinopril as an ACE inhibitor.  11/30/11- 45 yoM followed for sleep apnea and allergic rhinitis, complicated by morbid obesity and hx of GERD. Using VPAP every night for approx5-6 hours at night and pressure doing well; States he would like to speak about allergy vaccine-as his has expired-cant tell any difference since being off vaccne He continues VPAP 14/14/[employer] [facility] with documented good compliance and control. He is concerned that the mask pushes up under his orbits and might possibly worsen his astigmatism. He tried many different masks before settling on this when. We discussed alternatives to CPAP including oral appliances. He drifted off of allergy vaccine again, some months ago.  Was not very uncomfortable through this pollen season. I suggested we stay off vaccine and watch to see how he does. He will use antihistamines.  03/29/12- 46 yoM followed for sleep apnea and allergic rhinitis, complicated by morbid obesity and hx of GERD.   [facility]-Has not spoken with Dr Myrtis Ser about oral appliance; would like to discuss more with CY; still off of vaccine and doing well without it. He is still concerned that pressure on his face from his CPAP mask might bother his eyesight. He looked a lot of alternative masks. His own dentist, in [place] is training to make oral appliances. He has done very well off of allergy vaccine with little problems so far this fall.  Review of Systems-See HPI Constitutional:   No-   weight loss, night sweats, fevers, chills, fatigue, lassitude. HEENT:   No-  headaches, difficulty swallowing, tooth/dental problems, sore throat,       No-  sneezing, itching, ear ache, nasal congestion, post nasal drip,  CV:  No-   chest pain, orthopnea, PND, swelling in lower extremities, anasarca, dizziness, palpitations Resp: No-   shortness of breath with exertion or at rest.              No-   productive cough,  +non-productive cough,  No- coughing up of blood.              No-   change in color of mucus.  No- wheezing.   Skin: No-   rash or lesions. GI:  No-   heartburn, indigestion,  abdominal pain, nausea, vomiting,  GU:  MS:   Neuro-     nothing unusual Psych:  No- change in mood or affect. No depression or anxiety.  No memory loss.  Objective:   Physical Exam General- Alert, Oriented, Affect-appropriate, Distress- none acute, morbidly obese Skin- rash-none, lesions- none, excoriation- none Lymphadenopathy- none Head- atraumatic            Eyes- Gross vision intact, PERRLA, conjunctivae clear secretions            Ears- Hearing, canals-normal            Nose- Clear, no-Septal dev, mucus, polyps, erosion, perforation             Throat- Mallampati III-IV , mucosa  obscured, drainage- none, tonsils- atrophic Neck- flexible , trachea midline, no stridor , thyroid nl, carotid no bruit Chest - symmetrical excursion , unlabored           Heart/CV- RRR , no murmur , no gallop  , no rub, nl s1 s2                           - JVD- none , edema- none, stasis changes- none, varices- none           Lung- clear to P&A, wheeze- none, cough- none , dullness-none, rub- none           Chest wall-  Abd-  Br/ Gen/ Rectal- Not done, not indicated Extrem- cyanosis- none, clubbing, none, atrophy- none, strength- nl Neuro- grossly intact to observation

## 2012-04-06 NOTE — Assessment & Plan Note (Signed)
Good control at present time, watching progression through fall season. Discussed antihistamine use of.

## 2012-04-06 NOTE — Assessment & Plan Note (Signed)
Very mild intermittent asthma now not needing intervention.

## 2012-04-06 NOTE — Assessment & Plan Note (Signed)
He is worried the pressure on his face from his mask might somehow affect his eyesight. He is interested in seeing what his own dentist can do for an oral appliance -discussed that treatment direction.

## 2012-07-11 ENCOUNTER — Telehealth: Payer: Self-pay | Admitting: Internal Medicine

## 2012-07-11 NOTE — Telephone Encounter (Signed)
Please advise if okay to refill. Thanks.  

## 2012-07-12 MED ORDER — TRAZODONE HCL 50 MG PO TABS: 50.0000 mg | ORAL_TABLET | Freq: Every day | ORAL | Status: DC

## 2012-07-12 NOTE — Telephone Encounter (Signed)
Refill has been sent in.  

## 2012-07-12 NOTE — Telephone Encounter (Signed)
Ok to refill 

## 2013-03-28 ENCOUNTER — Encounter: Payer: Self-pay | Admitting: Internal Medicine

## 2013-03-28 ENCOUNTER — Ambulatory Visit (INDEPENDENT_AMBULATORY_CARE_PROVIDER_SITE_OTHER): Payer: Federal, State, Local not specified - PPO | Admitting: Internal Medicine

## 2013-03-28 VITALS — BP 128/70 | HR 72 | Ht 68.0 in | Wt 345.8 lb

## 2013-03-28 DIAGNOSIS — G4733 Obstructive sleep apnea (adult) (pediatric): Secondary | ICD-10-CM

## 2013-03-28 DIAGNOSIS — J309 Allergic rhinitis, unspecified: Secondary | ICD-10-CM

## 2013-03-28 DIAGNOSIS — J302 Other seasonal allergic rhinitis: Secondary | ICD-10-CM

## 2013-03-28 MED ORDER — TRAZODONE HCL 50 MG PO TABS: 50.0000 mg | ORAL_TABLET | Freq: Every day | ORAL | Status: DC

## 2013-03-28 NOTE — Patient Instructions (Addendum)
We can continue VPAP 14/14 [facility] at least until you get your mouthpiece.  Script sent to refill trazodone for sleep as needed.  Please call as needed

## 2013-03-28 NOTE — Progress Notes (Signed)
Subjective:    Patient ID: [name] [name], male    DOB: July 22, 1966, 47 y.o.   MRN: [id number]  HPI 12/01/10-45 yoM followed for sleep apnea and allergic rhinitis, complicated by morbid obesity and hx of GERD.  Last here June 09, 2010- note reviewed.  He did well through the winter. He got new cat, and has been outside on porch with old carpet, playing with the cat. In last 2 days has had more nasal congestion and drainage.  Has zyrtec but hadn't taken it. Denies cough, wheeze, chest tightness.  Continues allergy vaccine.  Continues VPAP at 15/15, able to use it all night, every night. Has not lost weight.  06/01/11- 45 yoM followed for sleep apnea and allergic rhinitis, complicated by morbid obesity and hx of GERD.  He declines flu vaccine. Doing well. Download documents excellent compliance and control with VPAP at inspiratory pressure 14 and expiratory pressure 14. He has had some problem with the quality and durability of his mask and hose. We talked about alternative suppliers. He got a cat in April. In recent months he has had increased throat clearing and dry cough. These wake him occasionally and are relieved by sips or hard candy. He is not really aware of reflux. We discussed the potential role of lisinopril as an ACE inhibitor.  11/30/11- 45 yoM followed for sleep apnea and allergic rhinitis, complicated by morbid obesity and hx of GERD. Using VPAP every night for approx5-6 hours at night and pressure doing well; States he would like to speak about allergy vaccine-as his has expired-cant tell any difference since being off vaccne He continues VPAP 14/14/[facility] with documented good compliance and control. He is concerned that the mask pushes up under his orbits and might possibly worsen his astigmatism. He tried many different masks before settling on this when. We discussed alternatives to CPAP including oral appliances. He drifted off of allergy vaccine again, some months ago.  Was not very uncomfortable through this pollen season. I suggested we stay off vaccine and watch to see how he does. He will use antihistamines.  03/29/12- 46 yoM followed for sleep apnea and allergic rhinitis, complicated by morbid obesity and hx of GERD.   OSA-Has not spoken with Dr Myrtis Ser about oral appliance; would like to discuss more with CY; still off of vaccine and doing well without it. He is still concerned that pressure on his face from his CPAP mask might bother his eyesight. He looked a lot of alternative masks. His own dentist, in [place] is training to make oral appliances. He has done very well off of allergy vaccine with little problems so far this fall.  03/28/13- 47 yoM followed for sleep apnea and allergic rhinitis, complicated by morbid obesity and hx of GERD. FOLLOWS FOR:03-13-13 was fitted for oral appliance; still using VPAP 14/14/ [facility] every night while waiting for oral appliance to be completed. Appliance is just waiting for manufacture.  He has been off of allergy vaccine several years and considers control good.  Review of Systems-See HPI Constitutional:   No-   weight loss, night sweats, fevers, chills, fatigue, lassitude. HEENT:   No-  headaches, difficulty swallowing, tooth/dental problems, sore throat,       No-  sneezing, itching, ear ache, nasal congestion, post nasal drip,  CV:  No-   chest pain, orthopnea, PND, swelling in lower extremities, anasarca, dizziness, palpitations Resp: No-   shortness of breath with exertion or at rest.  No-   productive cough,  +non-productive cough,  No- coughing up of blood.              No-   change in color of mucus.  No- wheezing.   Skin: No-   rash or lesions. GI:  No-   heartburn, indigestion, abdominal pain, nausea, vomiting,  GU:  MS:   Neuro-     nothing unusual Psych:  No- change in mood or affect. No depression or anxiety.  No memory loss.  Objective:   Physical Exam General- Alert, Oriented,  Affect-appropriate, Distress- none acute, morbidly obese Skin- rash-none, lesions- none, excoriation- none Lymphadenopathy- none Head- atraumatic            Eyes- Gross vision intact, PERRLA, conjunctivae clear secretions            Ears- Hearing, canals-normal            Nose- Clear, no-Septal dev, mucus, polyps, erosion, perforation             Throat- Mallampati III-IV , mucosa obscured, drainage- none, tonsils- atrophic Neck- flexible , trachea midline, no stridor , thyroid nl, carotid no bruit Chest - symmetrical excursion , unlabored           Heart/CV- RRR , no murmur , no gallop  , no rub, nl s1 s2                           - JVD- none , edema- none, stasis changes- none, varices- none           Lung- clear to P&A, wheeze- none, cough- none , dullness-none, rub- none           Chest wall-  Abd-  Br/ Gen/ Rectal- Not done, not indicated Extrem- cyanosis- none, clubbing, none, atrophy- none, strength- nl Neuro- grossly intact to observation

## 2013-04-04 NOTE — Assessment & Plan Note (Addendum)
He could never get BiPAP to work well for him but continues 14/14/ [facility], pending availability of oral appliance. We have emphasized importance of weight loss and his responsibility to drive safely [place] to try trazodone for insomnia component

## 2013-04-04 NOTE — Assessment & Plan Note (Signed)
Appropriate to remain off of allergy vaccine. Antihistamines if needed

## 2013-05-22 ENCOUNTER — Telehealth: Payer: Self-pay | Admitting: Internal Medicine

## 2013-05-22 ENCOUNTER — Ambulatory Visit (INDEPENDENT_AMBULATORY_CARE_PROVIDER_SITE_OTHER): Payer: Federal, State, Local not specified - PPO | Admitting: Internal Medicine

## 2013-05-22 ENCOUNTER — Encounter: Payer: Self-pay | Admitting: Internal Medicine

## 2013-05-22 VITALS — BP 144/72 | HR 87 | Ht 68.0 in | Wt 345.8 lb

## 2013-05-22 DIAGNOSIS — J45909 Unspecified asthma, uncomplicated: Secondary | ICD-10-CM

## 2013-05-22 DIAGNOSIS — J302 Other seasonal allergic rhinitis: Secondary | ICD-10-CM

## 2013-05-22 DIAGNOSIS — G4733 Obstructive sleep apnea (adult) (pediatric): Secondary | ICD-10-CM

## 2013-05-22 DIAGNOSIS — J309 Allergic rhinitis, unspecified: Secondary | ICD-10-CM

## 2013-05-22 MED ORDER — ALBUTEROL SULFATE HFA 108 (90 BASE) MCG/ACT IN AERS: INHALATION_SPRAY | RESPIRATORY_TRACT | Status: DC

## 2013-05-22 MED ORDER — LEVALBUTEROL HCL 0.63 MG/3ML IN NEBU
0.6300 mg | INHALATION_SOLUTION | Freq: Once | RESPIRATORY_TRACT | Status: AC
  Administered 2013-05-22: 0.63 mg via RESPIRATORY_TRACT

## 2013-05-22 MED ORDER — METHYLPREDNISOLONE ACETATE 80 MG/ML IJ SUSP
80.0000 mg | Freq: Once | INTRAMUSCULAR | Status: AC
  Administered 2013-05-22: 80 mg via INTRAMUSCULAR

## 2013-05-22 NOTE — Patient Instructions (Signed)
Neb  xop 0.63  Depo 80  Script to hold for albuterol rescue inhaler to use if needed for chest tightness, short of breath, wheeze

## 2013-05-22 NOTE — Telephone Encounter (Signed)
Pt is scheduled to see CDY at 9 am this AM.
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
## 2013-05-24 ENCOUNTER — Encounter: Payer: Self-pay | Admitting: *Deleted

## 2013-05-24 ENCOUNTER — Ambulatory Visit (INDEPENDENT_AMBULATORY_CARE_PROVIDER_SITE_OTHER)
Admission: RE | Admit: 2013-05-24 | Discharge: 2013-05-24 | Disposition: A | Discharge status: Home / Self Care | Payer: Federal, State, Local not specified - PPO | Source: Ambulatory Visit | Attending: Internal Medicine | Admitting: Internal Medicine

## 2013-05-24 ENCOUNTER — Encounter: Payer: Self-pay | Admitting: Internal Medicine

## 2013-05-24 ENCOUNTER — Ambulatory Visit (INDEPENDENT_AMBULATORY_CARE_PROVIDER_SITE_OTHER): Payer: Federal, State, Local not specified - PPO | Admitting: Internal Medicine

## 2013-05-24 ENCOUNTER — Telehealth: Payer: Self-pay | Admitting: Internal Medicine

## 2013-05-24 ENCOUNTER — Other Ambulatory Visit (INDEPENDENT_AMBULATORY_CARE_PROVIDER_SITE_OTHER): Payer: Federal, State, Local not specified - PPO

## 2013-05-24 VITALS — BP 124/68 | HR 74 | Temp 100.2°F | Ht 68.0 in | Wt 345.2 lb

## 2013-05-24 DIAGNOSIS — J309 Allergic rhinitis, unspecified: Secondary | ICD-10-CM

## 2013-05-24 DIAGNOSIS — J189 Pneumonia, unspecified organism: Secondary | ICD-10-CM

## 2013-05-24 DIAGNOSIS — J45909 Unspecified asthma, uncomplicated: Secondary | ICD-10-CM

## 2013-05-24 DIAGNOSIS — J302 Other seasonal allergic rhinitis: Secondary | ICD-10-CM

## 2013-05-24 LAB — CBC WITH DIFFERENTIAL/PLATELET
Basophils Absolute: 0 10*3/uL (ref 0.0–0.1)
HCT: 38.6 % — ABNORMAL LOW (ref 39.0–52.0)
Lymphs Abs: 1.3 10*3/uL (ref 0.7–4.0)
Monocytes Relative: 8.7 % (ref 3.0–12.0)
Neutro Abs: 7.6 10*3/uL (ref 1.4–7.7)
Platelets: 201 10*3/uL (ref 150.0–400.0)
RBC: 4.67 Mil/uL (ref 4.22–5.81)
RDW: 14.4 % (ref 11.5–14.6)

## 2013-05-24 LAB — BASIC METABOLIC PANEL
BUN: 13 mg/dL (ref 6–23)
Calcium: 9 mg/dL (ref 8.4–10.5)
GFR: 101.91 mL/min (ref >60.00)
Glucose, Bld: 157 mg/dL — ABNORMAL HIGH (ref 70–99)
Potassium: 4.5 mEq/L (ref 3.5–5.1)

## 2013-05-24 MED ORDER — CEFTRIAXONE SODIUM 500 MG IJ SOLR
500.0000 mg | Freq: Once | INTRAMUSCULAR | Status: AC
  Administered 2013-05-24: 500 mg via INTRAMUSCULAR

## 2013-05-24 MED ORDER — CLARITHROMYCIN 250 MG PO TABS: ORAL_TABLET | ORAL | Status: DC
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
## 2013-05-24 NOTE — Telephone Encounter (Signed)
Patient to be seen by CY this morning. Katie to address patient and place on schedule.

## 2013-05-24 NOTE — Patient Instructions (Addendum)
Order- Rocephin 500 mg IM x 1                          CXR Dx pneumonia Community acquired             Lab- CBC w/ diff, BMET  Script for Biaxin antibiotic  Skip your statin drug while on biaxin  LOA letter for this week Oct 27 through Nov 1

## 2013-05-25 NOTE — Progress Notes (Signed)
Quick Note:  Advised pt of cxr and lab results per CY. Pt verbalized understanding and will call if not better or any questions arise ______

## 2013-06-04 NOTE — Assessment & Plan Note (Signed)
I don't expect an oral appliance to work as well for him, but he has not liked PAP, so we will try

## 2013-06-04 NOTE — Assessment & Plan Note (Signed)
controlled 

## 2013-06-04 NOTE — Assessment & Plan Note (Signed)
Plan-nebulizer Xopenex, Depo-Medrol, prescription for pro-air rescue inhaler

## 2013-06-08 NOTE — Assessment & Plan Note (Signed)
Suspect community-acquired pneumonia Plan-leave of absence from work, October  27 through November 1 Chest x-ray, CBC, chemistry. Biaxin. Loading dose of Rocephin 250 mg IM

## 2013-06-08 NOTE — Assessment & Plan Note (Signed)
controlled 

## 2014-02-21 ENCOUNTER — Ambulatory Visit (INDEPENDENT_AMBULATORY_CARE_PROVIDER_SITE_OTHER): Payer: Federal, State, Local not specified - PPO

## 2014-02-21 VITALS — BP 134/77 | HR 75 | Resp 18

## 2014-02-21 DIAGNOSIS — M7671 Peroneal tendinitis, right leg: Secondary | ICD-10-CM

## 2014-02-21 DIAGNOSIS — M7750 Other enthesopathy of unspecified foot: Secondary | ICD-10-CM

## 2014-02-21 DIAGNOSIS — R609 Edema, unspecified: Secondary | ICD-10-CM

## 2014-02-21 DIAGNOSIS — R52 Pain, unspecified: Secondary | ICD-10-CM

## 2014-02-21 DIAGNOSIS — M7751 Other enthesopathy of right foot: Secondary | ICD-10-CM

## 2014-02-21 NOTE — Patient Instructions (Signed)
Tendinitis Tendinitis is swelling and inflammation of the tendons. Tendons are band-like tissues that connect muscle to bone. Tendinitis commonly occurs in the:   Shoulders (rotator cuff).  Heels (Achilles tendon).  Elbows (triceps tendon). CAUSES Tendinitis is usually caused by overusing the tendon, muscles, and joints involved. When the tissue surrounding a tendon (synovium) becomes inflamed, it is called tenosynovitis. Tendinitis commonly develops in people whose jobs require repetitive motions. SYMPTOMS  Pain.  Tenderness.  Mild swelling. DIAGNOSIS Tendinitis is usually diagnosed by physical exam. Your health care provider may also order X-rays or other imaging tests. TREATMENT Your health care provider may recommend certain medicines or exercises for your treatment. HOME CARE INSTRUCTIONS   Use a sling or splint for as long as directed by your health care provider until the pain decreases.  Put ice on the injured area.  Put ice in a plastic bag.  Place a towel between your skin and the bag.  Leave the ice on for 15-20 minutes, 3-4 times a day, or as directed by your health care provider.  Avoid using the limb while the tendon is painful. Perform gentle range of motion exercises only as directed by your health care provider. Stop exercises if pain or discomfort increase, unless directed otherwise by your health care provider.  Only take over-the-counter or prescription medicines for pain, discomfort, or fever as directed by your health care provider. SEEK MEDICAL CARE IF:   Your pain and swelling increase.  You develop new, unexplained symptoms, especially increased numbness in the hands. MAKE SURE YOU:   Understand these instructions.  Will watch your condition.  Will get help right away if you are not doing well or get worse. Document Released: 07/10/2000 Document Revised: 11/27/2013 Document Reviewed: 09/29/2010 [facility] Patient Information 2015 [employer],  [place]. This information is not intended to replace advice given to you by your health care provider. Make sure you discuss any questions you have with your health care provider.    WEARING INSTRUCTIONS FOR ORTHOTICS  Don't expect to be comfortable wearing your orthotic devices for the first time.  Like eyeglasses, you may be aware of them as time passes, they will not be uncomfortable and you will enjoy wearing them.  FOLLOW THESE INSTRUCTIONS EXACTLY!  Wear your orthotic devices for:       Not more than 1 hour the first day.       Not more than 2 hours the second day.       Not more than 3 hours the third day and so on.        Or wear them for as long as they feel comfortable.       If you experience discomfort in your feet or legs take them out.  When feet & legs feel       better, put them back in.  You do need to be consistent and wear them a little        everyday. 2.   If at any time the orthotic devices become acutely uncomfortable before the       time for that particular day, STOP WEARING THEM. 3.   On the next day, do not increase the wearing time. 4.   Subsequently, increase the wearing time by 15-30 minutes only if comfortable to do       so. 5.   You will be seen by your doctor about 2-4 weeks after you receive your orthotic       devices, at which  time you will probably be wearing your devices comfortably        for about 8 hours or more a day. 6.   Some patients occasionally report mild aches or discomfort in other parts of the of       body such as the knees, hips or back after 3 or 4 consecutive hours of wear.  If this       is the case with you, do not extend your wearing time.  Instead, cut it back an hour or       two.  In all likelihood, these symptoms will disappear in a short period of time as your       body posture realigns itself and functions more efficiently. 7.   It is possible that your orthotic device may require some small changes or adjustment       to  improve their function or make them more comfortable.   This is usually not done       before one to three months have elapsed.  These adjustments are made in        accordance with the changed position your feet are assuming as a result of       improved biomechanical function. 8.   In women's shoes, it's not unusual for your heel to slip out of the shoe, particularly if       they are step-in-shoes.  If this is the case, try other shoes or other styles.  Try to       purchase shoes which have deeper heal seats or higher heel counters. 9.   Squeaking of orthotics devices in the shoes is due to the movement of the devices       when they are functioning normally.  To eliminate squeaking, simply dust some       baby powder into your shoes before inserting the devices.  If this does not work,        apply soap or wax to the edges of the orthotic devices or put a tissue into the shoes. 10. It is important that you follow these directions explicitly.  Failure to do so will simply       prolong the adjustment period or create problems which are easily avoided.  It makes       no difference if you are wearing your orthotic devices for only a few hours after        several months, so long as you are wearing them comfortably for those hours. 11. If you have any questions or complaints, contact our office.  We have no way of       knowing about your problems unless you tell us.  If we do not hear from you, we will       assume that you are proceeding well.    Peripheral Edema You have swelling in your legs (peripheral edema). This swelling is due to excess accumulation of salt and water in your body. Edema may be a sign of heart, kidney or liver disease, or a side effect of a medication. It may also be due to problems in the leg veins. Elevating your legs and using special support stockings may be very helpful, if the cause of the swelling is due to poor venous circulation. Avoid long periods of  standing, whatever the cause. Treatment of edema depends on identifying the cause. Chips, pretzels, pickles and other salty foods should be avoided. Restricting salt in  your diet is almost always needed. Water pills (diuretics) are often used to remove the excess salt and water from your body via urine. These medicines prevent the kidney from reabsorbing sodium. This increases urine flow. Diuretic treatment may also result in lowering of potassium levels in your body. Potassium supplements may be needed if you have to use diuretics daily. Daily weights can help you keep track of your progress in clearing your edema. You should call your caregiver for follow up care as recommended. SEEK IMMEDIATE MEDICAL CARE IF:   You have increased swelling, pain, redness, or heat in your legs.  You develop shortness of breath, especially when lying down.  You develop chest or abdominal pain, weakness, or fainting.  You have a fever. Document Released: 08/20/2004 Document Revised: 10/05/2011 Document Reviewed: 07/31/2009 [facility] Patient Information 2015 [employer] [place]. This information is not intended to replace advice given to you by your health care provider. Make sure you discuss any questions you have with your health care provider. Recommended compression stockings for support hose from elastic therapy, 20-30 mm compression knee-high stockings would be beneficial. Apply stockings to both lower legs first thing in the morning to prevent swelling and edema maintain compression stocking use indefinitely

## 2014-02-21 NOTE — Progress Notes (Signed)
   Subjective:    Patient ID: [name] [name], male    DOB: 14-Aug-1965, 48 y.o.   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  Musculoskeletal: Positive for gait problem.       JOINT PAIN   All other systems reviewed and are negative.      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 There is good range of motion dorsiflexion plantar flexion inversion eversion of the right foot and ankle left is also unremarkable there is significant plus one +2 edema on the right side as opposed the left side for several years .  N objective findings as follows vascular status appears to be intact with pedal pulses palpable DP and PT +2/4 bilateral capillary refill timed re\re seconds all digits +2 edema right plus one of them on the left. Neurologically epicritic and proprioceptive sensations intact and symmetric bilateral normal plantar response DTRs not listed neurologically skin color pigment normal hair growth present but diminished distally nails unremarkable patient does have a history of possible DVT years ago when he travels overseas his right leg and palliative infection dehiscence are some cellulitis however left him with edematous right leg. He tried compression stockings and unsuccessfully in the past however they were never properly sized. X-rays orthopedic exam reveals significantly pronated foot type bilateral right more so than left  x-rays reveal on increased talar declination angle and decreased calcaneal inclination angle posterior middle facets are intact and the subtalar joint and mid tarsus joint shows and abrasion collapsed with a plantar-grade taping was noted mortise is otherwise unremarkable no fractures no cysts no other osseous abnormalities identified    Assessment & Plan:  Assessment this time is capsulitis posse peroneal tendinitis due to altered positioning in stability of the foot patient also indicates issues that were more flexible or softer tender roll and now he is in a new balance shoe with roll bar he be seen to be more stable in the symptoms have reduced. I advised to maintain a good stable shoe such as the new balance with roll bar also to resume using his orthotics he still has orthotics from previous history with Dr. Leeanne Deeduchman. Patient is also recommended compression stockings 20-30 mm compression to be used as instructed applied and first thing in the morning. Reevaluate with in one month if symptoms persist or fail to improve.  Alvan Dameichard  DPM

## 2014-04-03 ENCOUNTER — Encounter: Payer: Self-pay | Admitting: Internal Medicine

## 2014-04-03 ENCOUNTER — Telehealth: Payer: Self-pay | Admitting: Internal Medicine

## 2014-04-03 ENCOUNTER — Ambulatory Visit (INDEPENDENT_AMBULATORY_CARE_PROVIDER_SITE_OTHER): Payer: Federal, State, Local not specified - PPO | Admitting: Internal Medicine

## 2014-04-03 VITALS — BP 130/70 | HR 68 | Ht 68.0 in | Wt 349.2 lb

## 2014-04-03 DIAGNOSIS — G4733 Obstructive sleep apnea (adult) (pediatric): Secondary | ICD-10-CM

## 2014-04-03 DIAGNOSIS — J45909 Unspecified asthma, uncomplicated: Secondary | ICD-10-CM

## 2014-04-03 NOTE — Assessment & Plan Note (Signed)
No change in body build over many years that I have known him. Counseled repeatedly

## 2014-04-03 NOTE — Patient Instructions (Addendum)
Order- DME [facility]   Download for pressure compliance, refit/ replace mask for better fit- leaks too much. Replacement mask hose, supplies as needed    Dx OSA  Tell your primary physician about your experience with depomedrol injection for your lungs- turned out to help your sciatica.You and your doctor can plan strategy to manage as needed.

## 2014-04-03 NOTE — Progress Notes (Signed)
Subjective:    Patient ID: [name] [name], male    DOB: May 26, 1966, 48 y.o.   MRN: [id number]  HPI 12/01/10-45 yoM followed for sleep apnea and allergic rhinitis, complicated by morbid obesity and hx of GERD.  Last here June 09, 2010- note reviewed.  He did well through the winter. He got new cat, and has been outside on porch with old carpet, playing with the cat. In last 2 days has had more nasal congestion and drainage.  Has zyrtec but hadn't taken it. Denies cough, wheeze, chest tightness.  Continues allergy vaccine.  Continues VPAP at 15/15, able to use it all night, every night. Has not lost weight.  06/01/11- 45 yoM followed for sleep apnea and allergic rhinitis, complicated by morbid obesity and hx of GERD.  He declines flu vaccine. Doing well. Download documents excellent compliance and control with VPAP at inspiratory pressure 14 and expiratory pressure 14. He has had some problem with the quality and durability of his mask and hose. We talked about alternative suppliers. He got a cat in April. In recent months he has had increased throat clearing and dry cough. These wake him occasionally and are relieved by sips or hard candy. He is not really aware of reflux. We discussed the potential role of lisinopril as an ACE inhibitor.  11/30/11- 45 yoM followed for sleep apnea and allergic rhinitis, complicated by morbid obesity and hx of GERD. Using VPAP every night for approx5-6 hours at night and pressure doing well; States he would like to speak about allergy vaccine-as his has expired-cant tell any difference since being off vaccne He continues VPAP 14/14/[facility] with documented good compliance and control. He is concerned that the mask pushes up under his orbits and might possibly worsen his astigmatism. He tried many different masks before settling on this when. We discussed alternatives to CPAP including oral appliances. He drifted off of allergy vaccine again, some months ago.  Was not very uncomfortable through this pollen season. I suggested we stay off vaccine and watch to see how he does. He will use antihistamines.  03/29/12- 46 yoM followed for sleep apnea and allergic rhinitis, complicated by morbid obesity and hx of GERD.   OSA-Has not spoken with Dr Myrtis Ser about oral appliance; would like to discuss more with CY; still off of vaccine and doing well without it. He is still concerned that pressure on his face from his CPAP mask might bother his eyesight. He looked a lot of alternative masks. His own dentist, in [place] is training to make oral appliances. He has done very well off of allergy vaccine with little problems so far this fall.  03/28/13- 47 yoM followed for sleep apnea and allergic rhinitis, complicated by morbid obesity and hx of GERD. FOLLOWS FOR:03-13-13 was fitted for oral appliance; still using VPAP 14/14/ [facility] every night while waiting for oral appliance to be completed. Appliance is just waiting for manufacture.  He has been off of allergy vaccine several years and considers control good.  05/22/13- 47 yoM followed for sleep apnea and allergic rhinitis, complicated by morbid obesity and hx of GERD. ACUTE VISIT: recently at concert with "smoke"-feels like its gotten down in his lungs. "feels like bronchitis"  05/24/13- 47 yoM followed for sleep apnea and allergic rhinitis, complicated by morbid obesity and hx of GERD ACUTE VISIT: seen on Monday 05-22-13; not feeling any better; continues to have SOB and now coughing up yellow/green color phlegm since this morning.  Girl  friend here We gave neb, depo and a rescue inhaler 2 days ago. Cough is now productive yellow green. No GI upset. No fever  04/03/14- 48 yoM followed for sleep apnea and allergic rhinitis, complicated by morbid obesity and hx of GERD VPAP 14/14 or 15/15 [facility]            He declines flu shot "makes me sick" FOLLOWS FOR: Wears VPAP every night for 4.5 hours(if that);  pressure okay for patient. DME is [facility]-pt not sure that supplies are working well for him-will contact DME for this matter.  Unsure if mask is leaking-has to wear mask so tight that his cheeks are pressed in. He has always had trouble finding a mask that fits comfortably without leaking. Depo-Medrol shot given for bronchitis last year resolved sciatica pain for about 10 months. Pain is starting to come back and I've asked him to discuss this with his primary physician CXR 05/24/13-  IMPRESSION:  Hazy right lower lobe airspace disease which may reflect atelectasis  versus pneumonia.  Electronically Signed  By: Elige Ko  On: 05/24/2013 10:23  Review of Systems-See HPI Constitutional:   No-   weight loss, + sweats, fevers, chills, fatigue, lassitude. HEENT:   No-  headaches, difficulty swallowing, tooth/dental problems, sore throat,       No-  sneezing, itching, ear ache, +nasal congestion, +post nasal drip,  CV:  No-   chest pain, orthopnea, PND, swelling in lower extremities, anasarca, dizziness, palpitations Resp: No-   shortness of breath with exertion or at rest.            No- productive cough,  +non-productive cough,  No- coughing up of blood.             No-change in color of mucus.  No- wheezing.   Skin: No-   rash or lesions. GI:  No-   heartburn, indigestion, abdominal pain, nausea, vomiting,  GU:  MS:   Neuro-     nothing unusual Psych:  No- change in mood or affect. No depression or anxiety.  No memory loss.  Objective:   Physical Exam General- Alert, Oriented, Affect-appropriate, Distress- none acute, morbidly obese Skin- rash-none, lesions- none, excoriation- none Lymphadenopathy- none Head- atraumatic            Eyes- Gross vision intact, PERRLA, conjunctivae clear secretions            Ears- Hearing, canals-normal            Nose- Clear, no-Septal dev, mucus, polyps, erosion, perforation             Throat- Mallampati III-IV , mucosa obscured,  drainage- none, tonsils- atrophic Neck- flexible , trachea midline, no stridor , thyroid nl, carotid no bruit Chest - symmetrical excursion , unlabored           Heart/CV- RRR , no murmur , no gallop  , no rub, nl s1 s2                           - JVD- none , edema- none, stasis changes- none, varices- none           Lung- clear, wheeze- none, cough- none , dullness-none, rub- none           Chest wall-  Abd-  Br/ Gen/ Rectal- Not done, not indicated Extrem- cyanosis- none, clubbing, none, atrophy- none, strength- nl Neuro- grossly intact to observation

## 2014-04-03 NOTE — Telephone Encounter (Signed)
Medication list has been updated. Nothing further needed.  

## 2014-04-03 NOTE — Assessment & Plan Note (Signed)
We need to verify pressure with his home care company. The underlying problem is because of his obesity, he requires high pressures are hard to seal. He finds the leaks very frustrating. He could not make an oral appliance work for him

## 2014-04-03 NOTE — Assessment & Plan Note (Signed)
Controlled now

## 2014-09-21 ENCOUNTER — Ambulatory Visit (INDEPENDENT_AMBULATORY_CARE_PROVIDER_SITE_OTHER): Payer: Federal, State, Local not specified - PPO

## 2014-09-21 DIAGNOSIS — M79671 Pain in right foot: Secondary | ICD-10-CM

## 2014-09-21 DIAGNOSIS — M779 Enthesopathy, unspecified: Secondary | ICD-10-CM

## 2014-09-21 DIAGNOSIS — M7751 Other enthesopathy of right foot: Secondary | ICD-10-CM

## 2014-09-21 DIAGNOSIS — R52 Pain, unspecified: Secondary | ICD-10-CM

## 2014-09-21 DIAGNOSIS — M722 Plantar fascial fibromatosis: Secondary | ICD-10-CM

## 2014-09-21 DIAGNOSIS — M76821 Posterior tibial tendinitis, right leg: Secondary | ICD-10-CM

## 2014-09-21 DIAGNOSIS — M778 Other enthesopathies, not elsewhere classified: Secondary | ICD-10-CM

## 2014-09-21 MED ORDER — MELOXICAM 15 MG PO TABS: 15.0000 mg | ORAL_TABLET | Freq: Every day | ORAL | Status: DC

## 2014-09-21 NOTE — Patient Instructions (Signed)
ICE INSTRUCTIONS  Apply ice or cold pack to the affected area at least 3 times a day for 10-15 minutes each time.  You should also use ice after prolonged activity or vigorous exercise.  Do not apply ice longer than 20 minutes at one time.  Always keep a cloth between your skin and the ice pack to prevent burns.  Being consistent and following these instructions will help control your symptoms.  We suggest you purchase a gel ice pack because they are reusable and do bit leak.  Some of them are designed to wrap around the area.  Use the method that works best for you.  Here are some other suggestions for icing.   Use a frozen bag of peas or corn-inexpensive and molds well to your body, usually stays frozen for 10 to 20 minutes.  Wet a towel with cold water and squeeze out the excess until it's damp.  Place in a bag in the freezer for 20 minutes. Then remove and use.  Alternate hot compress and the ice pack 10 minutes of heat than 10 minutes of ice. Repeat 2 or 3 times every evening

## 2014-09-21 NOTE — Progress Notes (Signed)
   Subjective:    Patient ID: [name] DalesBobby Zartman, male    DOB: 25-Jun-1966, 49 y.o.   MRN: [id number]  HPI my right ankle started to hurt yesterday and has no burning or throbbing and doesn't get numb or tingle and hurts to walk and i am not a diabetic    Review of Systems no new findings or systemic changes noted. Patient is considerably overweight, does work [employer] does a lot of walking and standing and driving     Objective:   Physical Exam Lower extremity objective findings unchanged pedal pulses are palpable DP and PT +2 over 4 bilateral Refill time 3 seconds still edema +1 to +2 pitting suggested trying compression stockings once again has not been using them also has had some over OTC orthotics does not of custom insoles modified OTC orthotic in the does not have and currently is wearing new balance shoes which are more beneficial however recently developed some pain there is refusal pain on inversion of the foot and inversion of the subtalar joint ankle joint is nonpainful dorsal flexion plantar flexion or palpation of the ankle however the subtalar joint medial inferior malleoli are area along the posterior tibial distribution and sustentaculum tali area painful tender both on palpation and on inversion of the foot. The lateral sinus tarsi although not as severe also some mild tenderness along the medial band plantar fascia medial calcaneal tubercle region. Patient may be having some mild promontory changes are moderate predatory changes with moderate plantar fascial some bulging capsulitis of the subtalar joint. Remainder the exam unremarkable noncontributory x-rays reveal no fractures no cyst or tumors ankle has good normal alignment is noted. Cannot rule out soft tissue injury likely tendon ligament issues.       Assessment & Plan:  Assessment capsulitis of the subtalar joint and sub-one of the ankle mild plantar fascial symptoms bulging posterior tibial tendon tendinitis agent  at this time is a candidate for fascial strapping a prescription for meloxicam is issued recommend warm compress and ice pack and orthotic scan is carried at this time would benefit from functional orthoses to help stabilize foot both in the severe deformity promontory changes as well his his weight. Patient will be reevaluated within 4 weeks for orthotic pickup and fitting when ready in the interim fascial strapping is applied today maintain meloxicam is instructed in warm compress and ice pack as instructed  Alvan Dameichard  DPM

## 2014-09-25 ENCOUNTER — Encounter: Payer: Self-pay | Admitting: Internal Medicine

## 2014-09-25 ENCOUNTER — Ambulatory Visit (INDEPENDENT_AMBULATORY_CARE_PROVIDER_SITE_OTHER): Payer: Federal, State, Local not specified - PPO | Admitting: Internal Medicine

## 2014-09-25 ENCOUNTER — Encounter: Payer: Self-pay | Admitting: *Deleted

## 2014-09-25 ENCOUNTER — Telehealth: Payer: Self-pay | Admitting: Internal Medicine

## 2014-09-25 VITALS — BP 150/86 | HR 89 | Temp 97.7°F | Ht 68.0 in | Wt 346.2 lb

## 2014-09-25 DIAGNOSIS — J302 Other seasonal allergic rhinitis: Secondary | ICD-10-CM

## 2014-09-25 DIAGNOSIS — J209 Acute bronchitis, unspecified: Secondary | ICD-10-CM | POA: Insufficient documentation

## 2014-09-25 MED ORDER — AZITHROMYCIN 250 MG PO TABS: ORAL_TABLET | ORAL | Status: DC

## 2014-09-25 MED ORDER — LEVALBUTEROL HCL 0.63 MG/3ML IN NEBU
0.6300 mg | INHALATION_SOLUTION | Freq: Once | RESPIRATORY_TRACT | Status: AC
  Administered 2014-09-25: 0.63 mg via RESPIRATORY_TRACT

## 2014-09-25 MED ORDER — METHYLPREDNISOLONE ACETATE 80 MG/ML IJ SUSP
80.0000 mg | Freq: Once | INTRAMUSCULAR | Status: AC
  Administered 2014-09-25: 80 mg via INTRAMUSCULAR

## 2014-09-25 NOTE — Patient Instructions (Addendum)
Script sent for Z pak  Neb xop 0.63  Depo 80  Mucinex DM may help clear congestion and manage cough

## 2014-09-25 NOTE — Progress Notes (Signed)
Subjective:    Patient ID: [name] [name], male    DOB: May 26, 1966, 49 y.o.   MRN: [id number]  HPI 12/01/10-45 yoM followed for sleep apnea and allergic rhinitis, complicated by morbid obesity and hx of GERD.  Last here June 09, 2010- note reviewed.  He did well through the winter. He got new cat, and has been outside on porch with old carpet, playing with the cat. In last 2 days has had more nasal congestion and drainage.  Has zyrtec but hadn't taken it. Denies cough, wheeze, chest tightness.  Continues allergy vaccine.  Continues VPAP at 15/15, able to use it all night, every night. Has not lost weight.  06/01/11- 45 yoM followed for sleep apnea and allergic rhinitis, complicated by morbid obesity and hx of GERD.  He declines flu vaccine. Doing well. Download documents excellent compliance and control with VPAP at inspiratory pressure 14 and expiratory pressure 14. He has had some problem with the quality and durability of his mask and hose. We talked about alternative suppliers. He got a cat in April. In recent months he has had increased throat clearing and dry cough. These wake him occasionally and are relieved by sips or hard candy. He is not really aware of reflux. We discussed the potential role of lisinopril as an ACE inhibitor.  11/30/11- 45 yoM followed for sleep apnea and allergic rhinitis, complicated by morbid obesity and hx of GERD. Using VPAP every night for approx5-6 hours at night and pressure doing well; States he would like to speak about allergy vaccine-as his has expired-cant tell any difference since being off vaccne He continues VPAP 14/14/[facility] with documented good compliance and control. He is concerned that the mask pushes up under his orbits and might possibly worsen his astigmatism. He tried many different masks before settling on this when. We discussed alternatives to CPAP including oral appliances. He drifted off of allergy vaccine again, some months ago.  Was not very uncomfortable through this pollen season. I suggested we stay off vaccine and watch to see how he does. He will use antihistamines.  03/29/12- 46 yoM followed for sleep apnea and allergic rhinitis, complicated by morbid obesity and hx of GERD.   OSA-Has not spoken with Dr Myrtis Ser about oral appliance; would like to discuss more with CY; still off of vaccine and doing well without it. He is still concerned that pressure on his face from his CPAP mask might bother his eyesight. He looked a lot of alternative masks. His own dentist, in [place] is training to make oral appliances. He has done very well off of allergy vaccine with little problems so far this fall.  03/28/13- 47 yoM followed for sleep apnea and allergic rhinitis, complicated by morbid obesity and hx of GERD. FOLLOWS FOR:03-13-13 was fitted for oral appliance; still using VPAP 14/14/ [facility] every night while waiting for oral appliance to be completed. Appliance is just waiting for manufacture.  He has been off of allergy vaccine several years and considers control good.  05/22/13- 47 yoM followed for sleep apnea and allergic rhinitis, complicated by morbid obesity and hx of GERD. ACUTE VISIT: recently at concert with "smoke"-feels like its gotten down in his lungs. "feels like bronchitis"  05/24/13- 47 yoM followed for sleep apnea and allergic rhinitis, complicated by morbid obesity and hx of GERD ACUTE VISIT: seen on Monday 05-22-13; not feeling any better; continues to have SOB and now coughing up yellow/green color phlegm since this morning.  Girl  friend here We gave neb, depo and a rescue inhaler 2 days ago. Cough is now productive yellow green. No GI upset. No fever  04/03/14- 48 yoM followed for sleep apnea and allergic rhinitis, complicated by morbid obesity and hx of GERD VPAP 14/14 or 15/15 [facility]            He declines flu shot "makes me sick" FOLLOWS FOR: Wears VPAP every night for 4.5 hours(if that);  pressure okay for patient. DME is [facility]-pt not sure that supplies are working well for him-will contact DME for this matter.  Unsure if mask is leaking-has to wear mask so tight that his cheeks are pressed in. He has always had trouble finding a mask that fits comfortably without leaking. Depo-Medrol shot given for bronchitis last year resolved sciatica pain for about 10 months. Pain is starting to come back and I've asked him to discuss this with his primary physician CXR 05/24/13-  IMPRESSION:  Hazy right lower lobe airspace disease which may reflect atelectasis  versus pneumonia.  Electronically Signed  By: Elige Ko  On: 05/24/2013 10:23  09/25/14- 49 yoM followed for sleep apnea and allergic rhinitis, complicated by morbid obesity and hx of GERD, chronic RLE edema x years VPAP 14/14 or 15/15 [facility]    FOLLOWS FOR: pt c/o bronchititis like symptoms, he says it's painful to breath. He has pain in his lungs.  3-4 days acute illness- chest cold, chest congestion, productive cough clear mucus, lungs"burn" in cool air. Tussive soreness upper anterior chest.  He reminds me he has had RLE edema x years, no change.         Review of Systems-See HPI Constitutional:   No-   weight loss, + sweats, fevers, chills, fatigue, lassitude. HEENT:   No-  headaches, difficulty swallowing, tooth/dental problems, sore throat,       No-  sneezing, itching, ear ache, +nasal congestion, +post nasal drip,  CV:  + chest pain, orthopnea, PND, swelling in lower extremities, anasarca, dizziness, palpitations Resp: No-   shortness of breath with exertion or at rest.          + productive cough,  +non-productive cough,  No- coughing up of blood.             No-change in color of mucus.  No- wheezing.   Skin: No-   rash or lesions. GI:  No-   heartburn, indigestion, abdominal pain, nausea, vomiting,  GU:  MS:   Neuro-     nothing unusual Psych:  No- change in mood or affect. No  depression or anxiety.  No memory loss.  Objective:   Physical Exam General- Alert, Oriented, Affect-appropriate, Distress- none acute, morbidly obese Skin- rash-none, lesions- none, excoriation- none Lymphadenopathy- none Head- atraumatic            Eyes- Gross vision intact, PERRLA, conjunctivae clear secretions            Ears- Hearing, canals-normal            Nose- Clear, no-Septal dev, mucus, polyps, erosion, perforation             Throat- Mallampati III-IV , mucosa obscured, drainage- none, tonsils- atrophic Neck- flexible , trachea midline, no stridor , thyroid nl, carotid no bruit Chest - symmetrical excursion , unlabored           Heart/CV- RRR , no murmur , no gallop  , no rub, nl s1 s2                           -  JVD- none , edema+R leg chronic, stasis changes- none, varices- none           Lung- clear, wheeze- none, cough- none , dullness-none, rub- none           Chest wall-  Abd-  Br/ Gen/ Rectal- Not done, not indicated Extrem- cyanosis- none, clubbing, none, atrophy- none, strength- nl Neuro- grossly intact to observation

## 2014-09-25 NOTE — Telephone Encounter (Signed)
Pt being seen by CY in 9:00 slot.  Nothing further needed.

## 2014-09-25 NOTE — Assessment & Plan Note (Signed)
This is infection. Not yet triggering his asthma. Plan- Zpak, Neb xop, depomedrol, mucinex-DM

## 2014-10-12 ENCOUNTER — Ambulatory Visit (INDEPENDENT_AMBULATORY_CARE_PROVIDER_SITE_OTHER): Payer: Federal, State, Local not specified - PPO

## 2014-10-12 ENCOUNTER — Encounter: Payer: Self-pay | Admitting: *Deleted

## 2014-10-12 DIAGNOSIS — M722 Plantar fascial fibromatosis: Secondary | ICD-10-CM | POA: Diagnosis not present

## 2014-10-12 DIAGNOSIS — M7751 Other enthesopathy of right foot: Secondary | ICD-10-CM | POA: Diagnosis not present

## 2014-10-12 NOTE — Patient Instructions (Signed)

## 2014-10-12 NOTE — Progress Notes (Signed)
   Subjective:    Patient ID: [name] DalesBobby [name], male    DOB: 1965/09/19, 49 y.o.   MRN: [id number]  HPI I AM HERE TO GET MY INSERTS AND MY FEET ARE    Review of Systems no new changes or findings were noted     Objective:   Physical Exam Neurovascular status is intact pedal pulses palpable patient does have continued plantar fascial symptoms responded to strapping should respond to new functional orthoses. Orthotics are dispensed with break in wearing instructions are written instructions are given orthotics fit and contour with full contact of the foot and arch of the patient bilateral.       Assessment & Plan:  Assessment plantar fasciitis and capsulitis of the rear foot mid foot bilateral plan at this time orthotics are dispensed for biomechanical support or instructions for use and break in are given follow-up in a month or 2 for adjustments if needed maintain orthoses with appropriate shoes and lace up shoe [place] in follow-up with break in schedule as instructed  Alvan Dameichard  DPM

## 2014-11-23 ENCOUNTER — Ambulatory Visit (INDEPENDENT_AMBULATORY_CARE_PROVIDER_SITE_OTHER): Payer: Federal, State, Local not specified - PPO | Admitting: Podiatrist

## 2014-11-23 ENCOUNTER — Encounter: Payer: Self-pay | Admitting: Podiatrist

## 2014-11-23 VITALS — BP 130/93 | HR 68 | Resp 18

## 2014-11-23 DIAGNOSIS — M722 Plantar fascial fibromatosis: Secondary | ICD-10-CM | POA: Diagnosis not present

## 2014-11-23 DIAGNOSIS — M76821 Posterior tibial tendinitis, right leg: Secondary | ICD-10-CM | POA: Diagnosis not present

## 2014-11-23 NOTE — Progress Notes (Signed)
She presents today for follow-up of orthotics. He states that they're comfortable in the contour well. He has a pair of shoes that don't fit as well and however he thinks that the shoes are changed not the orthotics in particular. He does have several other pairs of shoes he can wear comfortably with the orthotics. The orthotics consult for the arch nicely and appeared to fit his foot well.  : Plantar fasciitis, tendinitis plan:   He'll continue wearing the orthotics and he will look into getting some new shoes. If any concerns arise, she will call in the future.

## 2015-02-22 ENCOUNTER — Telehealth: Payer: Self-pay | Admitting: Internal Medicine

## 2015-02-22 NOTE — Telephone Encounter (Signed)
Pt coming in Monday at 11:15 for depo.  Nothing further needed.

## 2015-02-22 NOTE — Telephone Encounter (Signed)
Patient was seen by Dr. Maple Hudson in 09/2014 (pt says last time he was seen was 04/2013 but he had been seen here in March of this year); patient came by office, says that he is having a lot of pain in his left hip that radiates down his left leg.  Patient says that he has had xrays and MRI's that do not show anything, he says that he had mentioned this pain to Dr. Maple Hudson at some point and Dr. Maple Hudson told him that he could be seen by Pain Management because we do not do pain management here.  Patient says that at his last OV, Dr. Maple Hudson gave him a DepoMedrol injection and it took away his pain in his hip.  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 Maple Hudson can do to help him.  No Known Allergies  Current Outpatient Prescriptions on File Prior to Visit  Medication Sig Dispense Refill  . albuterol (PROVENTIL HFA;VENTOLIN HFA) 108 (90 BASE) MCG/ACT inhaler 2 puffs every 4 hours if needed 1 Inhaler prn  . allopurinol (ZYLOPRIM) 100 MG tablet Take 100 mg by mouth.    Marland Kitchen allopurinol (ZYLOPRIM) 100 MG tablet     . aspirin 325 MG tablet Take 325 mg by mouth daily.      Marland Kitchen azithromycin (ZITHROMAX) 250 MG tablet 2 today then one daily 6 each 0  . carvedilol (COREG) 25 MG tablet Take by mouth.    . carvedilol (COREG) 6.25 MG tablet Take 6.25 mg by mouth daily.     . clotrimazole-betamethasone (LOTRISONE) cream Apply topically.    Marland Kitchen doxycycline (VIBRAMYCIN) 100 MG capsule     . hydrochlorothiazide (HYDRODIURIL) 50 MG tablet Take by mouth.    . hydrochlorothiazide (HYDRODIURIL) 50 MG tablet Take 25 mg by mouth.    . hydrochlorothiazide 25 MG tablet Take 12.5 mg by mouth daily.     . meloxicam (MOBIC) 15 MG tablet Take 1 tablet (15 mg total) by mouth daily. 30 tablet 1  . Multiple Vitamin (MULTIVITAMIN) tablet Take 1 tablet by mouth daily.    . simvastatin (ZOCOR)  80 MG tablet Take 80 mg by mouth daily.    . traZODone (DESYREL) 50 MG tablet Take 1 tablet (50 mg total) by mouth at bedtime. (Patient taking differently: Take 50 mg by mouth at bedtime as needed. ) 30 tablet prn   No current facility-administered medications on file prior to visit.

## 2015-02-22 NOTE — Telephone Encounter (Signed)
We could work him in next week and give a depo shot, but that would only buy a little time. He needs to see his PCP for referral to a rheumatologist or orthopedic doctor for evaluation, because this will keep coming back.

## 2015-02-25 ENCOUNTER — Ambulatory Visit (INDEPENDENT_AMBULATORY_CARE_PROVIDER_SITE_OTHER): Payer: Federal, State, Local not specified - PPO | Admitting: Internal Medicine

## 2015-02-25 ENCOUNTER — Encounter: Payer: Self-pay | Admitting: Internal Medicine

## 2015-02-25 VITALS — BP 144/70 | HR 63 | Ht 68.0 in | Wt 344.0 lb

## 2015-02-25 DIAGNOSIS — J4520 Mild intermittent asthma, uncomplicated: Secondary | ICD-10-CM

## 2015-02-25 DIAGNOSIS — J309 Allergic rhinitis, unspecified: Secondary | ICD-10-CM

## 2015-02-25 DIAGNOSIS — J3089 Other allergic rhinitis: Secondary | ICD-10-CM

## 2015-02-25 DIAGNOSIS — J209 Acute bronchitis, unspecified: Secondary | ICD-10-CM | POA: Diagnosis not present

## 2015-02-25 DIAGNOSIS — M545 Low back pain: Secondary | ICD-10-CM

## 2015-02-25 DIAGNOSIS — M79605 Pain in left leg: Principal | ICD-10-CM

## 2015-02-25 DIAGNOSIS — G4733 Obstructive sleep apnea (adult) (pediatric): Secondary | ICD-10-CM | POA: Diagnosis not present

## 2015-02-25 DIAGNOSIS — J302 Other seasonal allergic rhinitis: Secondary | ICD-10-CM

## 2015-02-25 MED ORDER — TRAMADOL HCL 50 MG PO TABS: ORAL_TABLET | ORAL | Status: DC

## 2015-02-25 MED ORDER — METHYLPREDNISOLONE ACETATE 80 MG/ML IJ SUSP
80.0000 mg | Freq: Once | INTRAMUSCULAR | Status: AC
  Administered 2015-02-25: 80 mg via INTRAMUSCULAR

## 2015-02-25 NOTE — Progress Notes (Signed)
Subjective:    Patient ID: [name] [name], male    DOB: 21-Sep-1965, 49 y.o.   MRN: [id number]  HPI 12/01/10-45 yoM followed for sleep apnea and allergic rhinitis, complicated by morbid obesity and hx of GERD.  Last here June 09, 2010- note reviewed.  He did well through the winter. He got new cat, and has been outside on porch with old carpet, playing with the cat. In last 2 days has had more nasal congestion and drainage.  Has zyrtec but hadn't taken it. Denies cough, wheeze, chest tightness.  Continues allergy vaccine.  Continues VPAP at 15/15, able to use it all night, every night. Has not lost weight.  06/01/11- 45 yoM followed for sleep apnea and allergic rhinitis, complicated by morbid obesity and hx of GERD.  He declines flu vaccine. Doing well. Download documents excellent compliance and control with VPAP at inspiratory pressure 14 and expiratory pressure 14. He has had some problem with the quality and durability of his mask and hose. We talked about alternative suppliers. He got a cat in April. In recent months he has had increased throat clearing and dry cough. These wake him occasionally and are relieved by sips or hard candy. He is not really aware of reflux. We discussed the potential role of lisinopril as an ACE inhibitor.  11/30/11- 45 yoM followed for sleep apnea and allergic rhinitis, complicated by morbid obesity and hx of GERD. Using VPAP every night for approx5-6 hours at night and pressure doing well; States he would like to speak about allergy vaccine-as his has expired-cant tell any difference since being off vaccne He continues VPAP 14/14/[facility] with documented good compliance and control. He is concerned that the mask pushes up under his orbits and might possibly worsen his astigmatism. He tried many different masks before settling on this when. We discussed alternatives to CPAP including oral appliances. He drifted off of allergy vaccine again, some months ago.  Was not very uncomfortable through this pollen season. I suggested we stay off vaccine and watch to see how he does. He will use antihistamines.  03/29/12- 46 yoM followed for sleep apnea and allergic rhinitis, complicated by morbid obesity and hx of GERD.   OSA-Has not spoken with Dr Myrtis Ser about oral appliance; would like to discuss more with CY; still off of vaccine and doing well without it. He is still concerned that pressure on his face from his CPAP mask might bother his eyesight. He looked a lot of alternative masks. His own dentist, in [place] is training to make oral appliances. He has done very well off of allergy vaccine with little problems so far this fall.  03/28/13- 47 yoM followed for sleep apnea and allergic rhinitis, complicated by morbid obesity and hx of GERD. FOLLOWS FOR:03-13-13 was fitted for oral appliance; still using VPAP 14/14/ [facility] every night while waiting for oral appliance to be completed. Appliance is just waiting for manufacture.  He has been off of allergy vaccine several years and considers control good.  05/22/13- 47 yoM followed for sleep apnea and allergic rhinitis, complicated by morbid obesity and hx of GERD. ACUTE VISIT: recently at concert with "smoke"-feels like its gotten down in his lungs. "feels like bronchitis"  05/24/13- 47 yoM followed for sleep apnea and allergic rhinitis, complicated by morbid obesity and hx of GERD ACUTE VISIT: seen on Monday 05-22-13; not feeling any better; continues to have SOB and now coughing up yellow/green color phlegm since this morning.  Girl  friend here We gave neb, depo and a rescue inhaler 2 days ago. Cough is now productive yellow green. No GI upset. No fever  04/03/14- 48 yoM followed for sleep apnea and allergic rhinitis, complicated by morbid obesity and hx of GERD VPAP 14/14 or 15/15 [facility]            He declines flu shot "makes me sick" FOLLOWS FOR: Wears VPAP every night for 4.5 hours(if that);  pressure okay for patient. DME is [facility]-pt not sure that supplies are working well for him-will contact DME for this matter.  Unsure if mask is leaking-has to wear mask so tight that his cheeks are pressed in. He has always had trouble finding a mask that fits comfortably without leaking. Depo-Medrol shot given for bronchitis last year resolved sciatica pain for about 10 months. Pain is starting to come back and I've asked him to discuss this with his primary physician CXR 05/24/13-  IMPRESSION:  Hazy right lower lobe airspace disease which may reflect atelectasis  versus pneumonia.  Electronically Signed  By: Elige Ko  On: 05/24/2013 10:23  09/25/14-     FOLLOWS FOR: pt c/o bronchititis like symptoms, he says it's painful to breath. He has pain in his lungs.  3-4 days acute illness- chest cold, chest congestion, productive cough clear mucus, lungs"burn" in cool air. Tussive soreness upper anterior chest.  He reminds me he has had RLE edema x years, no change.  02/25/15- 49 yoM followed for sleep apnea and allergic rhinitis, complicated by morbid obesity and hx of GERD, chronic RLE edema x years VPAP 14/14 or 15/15 [facility]  ACUTE VISIT: Pt here for Depo injection. 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 Complains of pain left anterior thigh into her groin and low back, worse standing. No benefit from chiropractor and orthopedist didn't see anything with x-ray or MRI. He remembers significant benefit for this from a steroid shot we gave for his breathing once before.       Review of Systems-See HPI Constitutional:   No-   weight loss, + sweats, fevers, chills, fatigue, lassitude. HEENT:   No-  headaches, difficulty swallowing, tooth/dental problems, sore throat,       No-  sneezing, itching, ear ache, +nasal congestion, +post nasal drip,  CV:  + chest pain, orthopnea, PND, swelling in  lower extremities, anasarca, dizziness, palpitations Resp: No-   shortness of breath with exertion or at rest.          + productive cough,  +non-productive cough,  No- coughing up of blood.             No-change in color of mucus.  No- wheezing.   Skin: No-   rash or lesions. GI:  No-   heartburn, indigestion, abdominal pain, nausea, vomiting,  GU:  MS: + Left low back and hip pain Neuro-     nothing unusual Psych:  No- change in mood or affect. No depression or anxiety.  No memory loss.  Objective:   Physical Exam General- Alert, Oriented, Affect-appropriate, Distress- none acute, + morbidly obese Skin- rash-none, lesions- none, excoriation- none Lymphadenopathy- none Head- atraumatic            Eyes- Gross vision intact, PERRLA, conjunctivae clear secretions            Ears- Hearing, canals-normal  Nose- Clear, no-Septal dev, mucus, polyps, erosion, perforation             Throat- Mallampati III-IV , mucosa obscured, drainage- none, tonsils- atrophic Neck- flexible , trachea midline, no stridor , thyroid nl, carotid no bruit Chest - symmetrical excursion , unlabored           Heart/CV- RRR , no murmur , no gallop  , no rub, nl s1 s2                           - JVD- none , edema+R leg chronic, stasis changes- none, varices- none           Lung- clear, wheeze- none, cough- none , dullness-none, rub- none           Chest wall-  Abd-  Br/ Gen/ Rectal- Not done, not indicated Extrem- cyanosis- none, clubbing, none, atrophy- none, strength- nl Neuro- grossly intact to observation

## 2015-02-25 NOTE — Patient Instructions (Addendum)
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 It may be something called meralgia. Suggest you go back to your primary physician and ask his direction about seeing a neurologist or sports medicine person if needed.   Keep the September appointment for your regular visit unless needed sooner

## 2015-03-19 DIAGNOSIS — M79605 Pain in left leg: Principal | ICD-10-CM

## 2015-03-19 DIAGNOSIS — M545 Low back pain, unspecified: Secondary | ICD-10-CM | POA: Insufficient documentation

## 2015-03-19 NOTE — Assessment & Plan Note (Signed)
Good control. Asthma does not wake him. Rare use of rescue inhaler.

## 2015-03-19 NOTE — Assessment & Plan Note (Signed)
Continuing allergy vaccine at 1:10. He feels it helps him.

## 2015-03-19 NOTE — Assessment & Plan Note (Signed)
This sounds like a nerve root irritation problem related to his repetitive motion at work. We agreed to let him try cortisone shot and gave some tramadol today with the understanding that I could not follow him for this provide definitive care. Plan-he will talk with his primary physician about a neurology or neurosurgery referral

## 2015-03-19 NOTE — Assessment & Plan Note (Signed)
He continues CPAP with good compliance and control

## 2015-03-19 NOTE — Assessment & Plan Note (Signed)
He is not yet ready to make a real effort about this

## 2015-04-02 ENCOUNTER — Ambulatory Visit (INDEPENDENT_AMBULATORY_CARE_PROVIDER_SITE_OTHER): Payer: Federal, State, Local not specified - PPO | Admitting: Internal Medicine

## 2015-04-02 ENCOUNTER — Encounter: Payer: Self-pay | Admitting: Internal Medicine

## 2015-04-02 VITALS — BP 136/78 | HR 60 | Ht 68.0 in | Wt 341.2 lb

## 2015-04-02 DIAGNOSIS — J4520 Mild intermittent asthma, uncomplicated: Secondary | ICD-10-CM

## 2015-04-02 DIAGNOSIS — G4733 Obstructive sleep apnea (adult) (pediatric): Secondary | ICD-10-CM | POA: Diagnosis not present

## 2015-04-02 MED ORDER — TRAZODONE HCL 50 MG PO TABS: ORAL_TABLET | ORAL | Status: DC

## 2015-04-02 NOTE — Progress Notes (Signed)
Subjective:    Patient ID: [name] [name], male    DOB: May 26, 1966, 49 y.o.   MRN: [id number]  HPI 12/01/10-45 yoM followed for sleep apnea and allergic rhinitis, complicated by morbid obesity and hx of GERD.  Last here June 09, 2010- note reviewed.  He did well through the winter. He got new cat, and has been outside on porch with old carpet, playing with the cat. In last 2 days has had more nasal congestion and drainage.  Has zyrtec but hadn't taken it. Denies cough, wheeze, chest tightness.  Continues allergy vaccine.  Continues VPAP at 15/15, able to use it all night, every night. Has not lost weight.  06/01/11- 45 yoM followed for sleep apnea and allergic rhinitis, complicated by morbid obesity and hx of GERD.  He declines flu vaccine. Doing well. Download documents excellent compliance and control with VPAP at inspiratory pressure 14 and expiratory pressure 14. He has had some problem with the quality and durability of his mask and hose. We talked about alternative suppliers. He got a cat in April. In recent months he has had increased throat clearing and dry cough. These wake him occasionally and are relieved by sips or hard candy. He is not really aware of reflux. We discussed the potential role of lisinopril as an ACE inhibitor.  11/30/11- 45 yoM followed for sleep apnea and allergic rhinitis, complicated by morbid obesity and hx of GERD. Using VPAP every night for approx5-6 hours at night and pressure doing well; States he would like to speak about allergy vaccine-as his has expired-cant tell any difference since being off vaccne He continues VPAP 14/14/[facility] with documented good compliance and control. He is concerned that the mask pushes up under his orbits and might possibly worsen his astigmatism. He tried many different masks before settling on this when. We discussed alternatives to CPAP including oral appliances. He drifted off of allergy vaccine again, some months ago.  Was not very uncomfortable through this pollen season. I suggested we stay off vaccine and watch to see how he does. He will use antihistamines.  03/29/12- 46 yoM followed for sleep apnea and allergic rhinitis, complicated by morbid obesity and hx of GERD.   OSA-Has not spoken with [name] [name] about oral appliance; would like to discuss more with CY; still off of vaccine and doing well without it. He is still concerned that pressure on his face from his CPAP mask might bother his eyesight. He looked a lot of alternative masks. His own dentist, in [place] is training to make oral appliances. He has done very well off of allergy vaccine with little problems so far this fall.  03/28/13- 47 yoM followed for sleep apnea and allergic rhinitis, complicated by morbid obesity and hx of GERD. FOLLOWS FOR:03-13-13 was fitted for oral appliance; still using VPAP 14/14/ [facility] every night while waiting for oral appliance to be completed. Appliance is just waiting for manufacture.  He has been off of allergy vaccine several years and considers control good.  05/22/13- 47 yoM followed for sleep apnea and allergic rhinitis, complicated by morbid obesity and hx of GERD. ACUTE VISIT: recently at concert with "smoke"-feels like its gotten down in his lungs. "feels like bronchitis"  05/24/13- 47 yoM followed for sleep apnea and allergic rhinitis, complicated by morbid obesity and hx of GERD ACUTE VISIT: seen on Monday 05-22-13; not feeling any better; continues to have SOB and now coughing up yellow/green color phlegm since this morning.  Girl  friend here We gave neb, depo and a rescue inhaler 2 days ago. Cough is now productive yellow green. No GI upset. No fever  04/03/14- 48 yoM followed for sleep apnea and allergic rhinitis, complicated by morbid obesity and hx of GERD VPAP 14/14 or 15/15 [facility]            He declines flu shot "makes me sick" FOLLOWS FOR: Wears VPAP every night for 4.5 hours(if that);  pressure okay for patient. DME is [facility]-pt not sure that supplies are working well for him-will contact DME for this matter.  Unsure if mask is leaking-has to wear mask so tight that his cheeks are pressed in. He has always had trouble finding a mask that fits comfortably without leaking. Depo-Medrol shot given for bronchitis last year resolved sciatica pain for about 10 months. Pain is starting to come back and I've asked him to discuss this with his primary physician CXR 05/24/13-  IMPRESSION:  Hazy right lower lobe airspace disease which may reflect atelectasis  versus pneumonia.  Electronically Signed  By: Elige Ko  On: 05/24/2013 10:23  09/25/14-     FOLLOWS FOR: pt c/o bronchititis like symptoms, he says it's painful to breath. He has pain in his lungs.  3-4 days acute illness- chest cold, chest congestion, productive cough clear mucus, lungs"burn" in cool air. Tussive soreness upper anterior chest.  He reminds me he has had RLE edema x years, no change.  02/25/15- 49 yoM followed for sleep apnea and allergic rhinitis, complicated by morbid obesity and hx of GERD, chronic RLE edema x years VPAP 14/14 or 15/15 [facility]  ACUTE VISIT: Pt here for Depo injection. 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 Complains of pain left anterior thigh into her groin and low back, worse standing. No benefit from chiropractor and orthopedist didn't see anything with x-ray or MRI. He remembers significant benefit for this from a steroid shot we gave for his breathing once before.  04/02/15-49 yoM followed for sleep apnea and allergic rhinitis, complicated by morbid obesity and hx of GERD, chronic RLE edema x years VPAP 14/14 or 15/15 [facility]  follows for: pt states he has no issue faslling alseep he just can not stay asleep. pt using VPAP everynight for about 5 hours. mask and pressure  is okay. DME: [facility]. Chronic repeated waking after sleep onset. May get drowsy in chair after work but states ok driving. Occ tea or pepsi.  Stopped trazodone- 50 mg didn't help sleep quality.       Review of Systems-See HPI Constitutional:   No-   weight loss, + sweats, fevers, chills, fatigue, lassitude. HEENT:   No-  headaches, difficulty swallowing, tooth/dental problems, sore throat,       No-  sneezing, itching, ear ache, +nasal congestion, +post nasal drip,  CV:  + chest pain, orthopnea, PND, swelling in lower extremities, anasarca, dizziness, palpitations Resp: No-   shortness of breath with exertion or at rest.          + productive cough,  +non-productive cough,  No- coughing up of blood.             No-change in color of mucus.  No- wheezing.   Skin: No-   rash or lesions. GI:  No-   heartburn, indigestion, abdominal pain, nausea, vomiting,  GU:  MS: + Left  low back and hip pain Neuro-     nothing unusual Psych:  No- change in mood or affect. No depression or anxiety.  No memory loss.  Objective:   Physical Exam General- Alert, Oriented, Affect-appropriate, Distress- none acute, + morbidly obese Skin- rash-none, lesions- none, excoriation- none Lymphadenopathy- none Head- atraumatic            Eyes- Gross vision intact, PERRLA, conjunctivae clear secretions            Ears- Hearing, canals-normal            Nose- Clear, no-Septal dev, mucus, polyps, erosion, perforation             Throat- Mallampati III-IV , mucosa obscured, drainage- none, tonsils- atrophic Neck- flexible , trachea midline, no stridor , thyroid nl, carotid no bruit Chest - symmetrical excursion , unlabored           Heart/CV- RRR , no murmur , no gallop  , no rub, nl s1 s2                           - JVD- none , edema+R leg chronic, stasis changes- none, varices- none           Lung- clear, wheeze- none, cough- none , dullness-none, rub- none           Chest wall-  Abd-  Br/ Gen/  Rectal- Not done, not indicated Extrem- cyanosis- none, clubbing, none, atrophy- none, strength- nl, +cane Neuro- grossly intact to observation

## 2015-04-02 NOTE — Patient Instructions (Addendum)
Trazodone script for sleep sent- try 2 at bedtime  Order- download VPAP for pressure compliance  DME [facility]       Dx OSA  Suggest you ask your primary doctor orthopedic referral for your pains  Handicapped parking

## 2015-04-03 NOTE — Assessment & Plan Note (Signed)
He is compliant with VPAP. We need download to assess objectively- ordered. Insomnia component could be from inadequate control, oir a separate problem. Plan- retry trazodone 50-100 mg for sleep, download

## 2015-04-03 NOTE — Assessment & Plan Note (Signed)
Well controlled w/o recent active symptoms. Not needing rescue and doesn't notice asthma at night.

## 2015-04-03 NOTE — Assessment & Plan Note (Signed)
He is not trying to lose weight, but is having more joint and back pains likely disk related. Hope this will begin to motivate him.

## 2015-04-17 ENCOUNTER — Other Ambulatory Visit: Payer: Self-pay | Admitting: Orthopedic Surgery

## 2015-04-17 DIAGNOSIS — Z01818 Encounter for other preprocedural examination: Secondary | ICD-10-CM

## 2015-04-18 ENCOUNTER — Ambulatory Visit
Admission: RE | Admit: 2015-04-18 | Discharge: 2015-04-18 | Disposition: A | Discharge status: Home / Self Care | Payer: Federal, State, Local not specified - PPO | Source: Ambulatory Visit | Attending: Orthopedic Surgery | Admitting: Orthopedic Surgery

## 2015-04-18 DIAGNOSIS — Z01818 Encounter for other preprocedural examination: Secondary | ICD-10-CM

## 2015-09-30 ENCOUNTER — Encounter: Payer: Self-pay | Admitting: Internal Medicine

## 2015-09-30 ENCOUNTER — Ambulatory Visit (INDEPENDENT_AMBULATORY_CARE_PROVIDER_SITE_OTHER): Payer: Federal, State, Local not specified - PPO | Admitting: Podiatry

## 2015-09-30 ENCOUNTER — Encounter: Payer: Self-pay | Admitting: Podiatry

## 2015-09-30 ENCOUNTER — Ambulatory Visit (INDEPENDENT_AMBULATORY_CARE_PROVIDER_SITE_OTHER): Payer: Federal, State, Local not specified - PPO | Admitting: Internal Medicine

## 2015-09-30 VITALS — BP 155/83 | HR 76 | Resp 16

## 2015-09-30 VITALS — BP 122/68 | HR 81 | Ht 68.0 in | Wt 354.6 lb

## 2015-09-30 DIAGNOSIS — J4520 Mild intermittent asthma, uncomplicated: Secondary | ICD-10-CM | POA: Diagnosis not present

## 2015-09-30 DIAGNOSIS — M79605 Pain in left leg: Secondary | ICD-10-CM

## 2015-09-30 DIAGNOSIS — M545 Low back pain: Secondary | ICD-10-CM

## 2015-09-30 DIAGNOSIS — L03031 Cellulitis of right toe: Secondary | ICD-10-CM | POA: Diagnosis not present

## 2015-09-30 DIAGNOSIS — L600 Ingrowing nail: Secondary | ICD-10-CM

## 2015-09-30 DIAGNOSIS — G4733 Obstructive sleep apnea (adult) (pediatric): Secondary | ICD-10-CM | POA: Diagnosis not present

## 2015-09-30 NOTE — Progress Notes (Signed)
Subjective:     Patient ID: [name] DalesBobby [name], male   DOB: May 06, 1966, 50 y.o.   MRN: [id number]  HPI this patient presents the office with chief complaint of a painful second toe right foot. He states that the nail is ingrowing on the inside border second toe right foot. He says he has had pain for the last 10 days and has attempted to cut the offending nail border out himself. He says the toe has actually improved in the last 2 days but is aware that the nail is causing the problem. He presents the office today for an evaluation and treatment of this condition. He states his second toe right foot has been painful walking and wearing his shoes due to the ingrowing toenail   Review of Systems     Objective:   Physical Exam GENERAL APPEARANCE: Alert, conversant. Appropriately groomed. No acute distress.  VASCULAR: Pedal pulses palpable at  [facility] and PT bilateral.  Capillary refill time is immediate to all digits,  Normal temperature gradient.  Digital hair growth is present bilateral  NEUROLOGIC: sensation is normal to 5.07 monofilament at 5/5 sites bilateral.  Light touch is intact bilateral, Muscle strength normal.  MUSCULOSKELETAL: acceptable muscle strength, tone and stability bilateral.  Intrinsic muscluature intact bilateral.  Rectus appearance of foot and digits noted bilateral.   DERMATOLOGIC: skin color, texture, and turgor are within normal limits.  No preulcerative lesions or ulcers  are seen, no interdigital maceration noted.  No open lesions present.  . No drainage noted. NAILS  Marked incurvation medial border with granulation tissue medial border.  Redness and swelling and pain noted.     Assessment:     Ingrown Toenail second toe right foot   Paronychia second toe right.     Plan:     ROV  Nail surgery.  Treatment options and alternatives discussed.  Recommended permanent phenol matrixectomy and patient agreed.  Right second toe was prepped with alcohol and a toe block of 3cc of 2%  lidocaine plain was administered in a digital toe block. .  The toe was then prepped with betadine solution .  The offending nail border was then excised and matrix tissue exposed.  Phenol was then applied to the matrix tissue followed by an alcohol wash.  Antibiotic ointment and a dry sterile dressing was applied.  The patient was dispensed instructions for aftercare. RTC 1 week.. No evidence of abscess therefore no antibiotics were prescribed.     Helane GuntherGregory  DPM

## 2015-09-30 NOTE — Progress Notes (Signed)
Subjective:    Patient ID: [name] [name], male    DOB: 02/05/1966, 50 y.o.   MRN: [id number]  HPI 12/01/10-45 yoM followed for sleep apnea and allergic rhinitis, complicated by morbid obesity and hx of GERD.  Last here June 09, 2010- note reviewed.  He did well through the winter. He got new cat, and has been outside on porch with old carpet, playing with the cat. In last 2 days has had more nasal congestion and drainage.  Has zyrtec but hadn't taken it. Denies cough, wheeze, chest tightness.  Continues allergy vaccine.  Continues VPAP at 15/15, able to use it all night, every night. Has not lost weight.  06/01/11- 45 yoM followed for sleep apnea and allergic rhinitis, complicated by morbid obesity and hx of GERD.  He declines flu vaccine. Doing well. Download documents excellent compliance and control with VPAP at inspiratory pressure 14 and expiratory pressure 14. He has had some problem with the quality and durability of his mask and hose. We talked about alternative suppliers. He got a cat in April. In recent months he has had increased throat clearing and dry cough. These wake him occasionally and are relieved by sips or hard candy. He is not really aware of reflux. We discussed the potential role of lisinopril as an ACE inhibitor.  11/30/11- 45 yoM followed for sleep apnea and allergic rhinitis, complicated by morbid obesity and hx of GERD. Using VPAP every night for approx5-6 hours at night and pressure doing well; States he would like to speak about allergy vaccine-as his has expired-cant tell any difference since being off vaccne He continues VPAP 14/14/[facility] with documented good compliance and control. He is concerned that the mask pushes up under his orbits and might possibly worsen his astigmatism. He tried many different masks before settling on this when. We discussed alternatives to CPAP including oral appliances. He drifted off of allergy vaccine again, some months ago.  Was not very uncomfortable through this pollen season. I suggested we stay off vaccine and watch to see how he does. He will use antihistamines.  03/29/12- 46 yoM followed for sleep apnea and allergic rhinitis, complicated by morbid obesity and hx of GERD.   OSA-Has not spoken with Dr Myrtis SerKatz about oral appliance; would like to discuss more with CY; still off of vaccine and doing well without it. He is still concerned that pressure on his face from his CPAP mask might bother his eyesight. He looked a lot of alternative masks. His own dentist, in [place] is training to make oral appliances. He has done very well off of allergy vaccine with little problems so far this fall.  03/28/13- 47 yoM followed for sleep apnea and allergic rhinitis, complicated by morbid obesity and hx of GERD. FOLLOWS FOR:03-13-13 was fitted for oral appliance; still using VPAP 14/14/ [facility] every night while waiting for oral appliance to be completed. Appliance is just waiting for manufacture.  He has been off of allergy vaccine several years and considers control good.  05/22/13- 47 yoM followed for sleep apnea and allergic rhinitis, complicated by morbid obesity and hx of GERD. ACUTE VISIT: recently at concert with "smoke"-feels like its gotten down in his lungs. "feels like bronchitis"  05/24/13- 47 yoM followed for sleep apnea and allergic rhinitis, complicated by morbid obesity and hx of GERD ACUTE VISIT: seen on Monday 05-22-13; not feeling any better; continues to have SOB and now coughing up yellow/green color phlegm since this morning.  Girl  friend here We gave neb, depo and a rescue inhaler 2 days ago. Cough is now productive yellow green. No GI upset. No fever  04/03/14- 48 yoM followed for sleep apnea and allergic rhinitis, complicated by morbid obesity and hx of GERD VPAP 14/14 or 15/15 [facility]            He declines flu shot "makes me sick" FOLLOWS FOR: Wears VPAP every night for 4.5 hours(if that);  pressure okay for patient. DME is [facility]-pt not sure that supplies are working well for him-will contact DME for this matter.  Unsure if mask is leaking-has to wear mask so tight that his cheeks are pressed in. He has always had trouble finding a mask that fits comfortably without leaking. Depo-Medrol shot given for bronchitis last year resolved sciatica pain for about 10 months. Pain is starting to come back and I've asked him to discuss this with his primary physician CXR 05/24/13-  IMPRESSION:  Hazy right lower lobe airspace disease which may reflect atelectasis  versus pneumonia.  Electronically Signed  By: Elige Ko  On: 05/24/2013 10:23  09/25/14-     FOLLOWS FOR: pt c/o bronchititis like symptoms, he says it's painful to breath. He has pain in his lungs.  3-4 days acute illness- chest cold, chest congestion, productive cough clear mucus, lungs"burn" in cool air. Tussive soreness upper anterior chest.  He reminds me he has had RLE edema x years, no change.  02/25/15- 49 yoM followed for sleep apnea and allergic rhinitis, complicated by morbid obesity and hx of GERD, chronic RLE edema x years VPAP 14/14 or 15/15 [facility]  ACUTE VISIT: Pt here for Depo injection. 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 Complains of pain left anterior thigh into her groin and low back, worse standing. No benefit from chiropractor and orthopedist didn't see anything with x-ray or MRI. He remembers significant benefit for this from a steroid shot we gave for his breathing once before.  04/02/15-49 yoM followed for sleep apnea and allergic rhinitis, complicated by morbid obesity and hx of GERD, chronic RLE edema x years VPAP 14/14 or 15/15 [facility]  follows for: pt states he has no issue faslling alseep he just can not stay asleep. pt using VPAP everynight for about 5 hours. mask and pressure  is okay. DME: [facility]. Chronic repeated waking after sleep onset. May get drowsy in chair after work but states ok driving. Occ tea or pepsi.  Stopped trazodone- 50 mg didn't help sleep quality.  09/30/2015-50 year old male never smoker followed for OSA, allergic rhinitis, complicated by morbid obesity, GERD, chronic RLE edema  years VPAP 14/14  [facility] FOLLOWS FOR: Pt wears VPAP every night for about 6 hours; DME is [facility]. DL attached.  Download supports his report that he uses CPAP every night and does very well. Back pain has been relieved by surgery and no longer interferes with sleep.      Review of Systems-See HPI Constitutional:   No-   weight loss, + sweats, fevers, chills, fatigue, lassitude. HEENT:   No-  headaches, difficulty swallowing, tooth/dental problems, sore throat,       No-  sneezing, itching, ear ache, nasal congestion, +post nasal drip,  CV:  chest pain, orthopnea, PND, swelling in lower extremities, anasarca, dizziness, palpitations Resp: No-   shortness of breath with exertion  or at rest.          productive cough,  +non-productive cough,  No- coughing up of blood.             No-change in color of mucus.  No- wheezing.   Skin: No-   rash or lesions. GI:  No-   heartburn, indigestion, abdominal pain, nausea, vomiting,  GU:  MS:  Left low back and hip pain Neuro-     nothing unusual Psych:  No- change in mood or affect. No depression or anxiety.  No memory loss.  Objective:   Physical Exam General- Alert, Oriented, Affect-appropriate, Distress- none acute, + morbidly obese Skin- rash-none, lesions- none, excoriation- none Lymphadenopathy- none Head- atraumatic            Eyes- Gross vision intact, PERRLA, conjunctivae clear secretions            Ears- Hearing, canals-normal            Nose- Clear, no-Septal dev, mucus, polyps, erosion, perforation             Throat- Mallampati III-IV , mucosa obscured, drainage-  none, tonsils- atrophic Neck- flexible , trachea midline, no stridor , thyroid nl, carotid no bruit Chest - symmetrical excursion , unlabored           Heart/CV- RRR , no murmur , no gallop  , no rub, nl s1 s2                           - JVD- none , edema+R leg chronic, stasis changes- none, varices- none           Lung- clear, wheeze- none, cough- none , dullness-none, rub- none           Chest wall-  Abd-  Br/ Gen/ Rectal- Not done, not indicated Extrem- cyanosis- none, clubbing, none, atrophy- none, strength- nl, +cane Neuro- grossly intact to observation

## 2015-09-30 NOTE — Patient Instructions (Signed)
We can continue current VPAP 14/14/ [facility]  Please call as needed

## 2015-10-01 NOTE — Assessment & Plan Note (Signed)
Relieved by surgery

## 2015-10-01 NOTE — Assessment & Plan Note (Signed)
Quality of life is much better using VPAP and he is comfortable with it now. Weight loss would help.

## 2015-10-01 NOTE — Assessment & Plan Note (Signed)
He has not been willing to make necessary lifestyle changes to, his weight loss

## 2015-10-01 NOTE — Assessment & Plan Note (Signed)
Well-controlled with only occasional use of rescue inhaler

## 2015-10-07 ENCOUNTER — Encounter: Payer: Self-pay | Admitting: Internal Medicine

## 2015-10-07 ENCOUNTER — Encounter: Payer: Self-pay | Admitting: Podiatry

## 2015-10-07 ENCOUNTER — Ambulatory Visit (INDEPENDENT_AMBULATORY_CARE_PROVIDER_SITE_OTHER): Payer: Federal, State, Local not specified - PPO | Admitting: Podiatry

## 2015-10-07 VITALS — BP 140/79 | HR 67 | Resp 16

## 2015-10-07 DIAGNOSIS — Z09 Encounter for follow-up examination after completed treatment for conditions other than malignant neoplasm: Secondary | ICD-10-CM

## 2015-10-07 NOTE — Progress Notes (Signed)
Patient ID: Clide DalesBobby Ohanesian, male   DOB: 14-Jun-1966, 50 y.o.   MRN: [id number] This patient returns to the office following nail surgery one week ago.  The patient says toe has been soaked and bandaged as directed.  There has been improvement of the toe since the surgery has been performed. The patient presents for continued evaluation and treatment.  GENERAL APPEARANCE: Alert, conversant. Appropriately groomed. No acute distress.  VASCULAR: [facility]  [facility].  Capillary refill time is immediate to all digits,  Normal temperature gradient.    NEUROLOGIC: sensation is normal to 5.07 monofilament at 5/5 sites bilateral.  Light touch is intact bilateral, Muscle strength normal.  MUSCULOSKELETAL: acceptable muscle strength, tone and stability bilateral.  Intrinsic muscluature intact bilateral.  Rectus appearance of foot and digits noted bilateral.   DERMATOLOGIC: skin color, texture, and turgor are within normal limits.  No preulcerative lesions or ulcers  are seen, no interdigital maceration noted.   NAILS  There is necrotic tissue along the nail groove  In the absence of redness swelling and pain.  DX  S/p nail surgery  ROV  Home instructions were discussed.  Patient to call the office if there are any questions or concerns.   Helane GuntherGregory  DPM

## 2016-07-06 ENCOUNTER — Other Ambulatory Visit: Payer: Self-pay | Admitting: Orthopedic Surgery

## 2016-07-06 DIAGNOSIS — M5412 Radiculopathy, cervical region: Secondary | ICD-10-CM

## 2016-07-11 ENCOUNTER — Ambulatory Visit
Admission: RE | Admit: 2016-07-11 | Discharge: 2016-07-11 | Disposition: A | Discharge status: Home / Self Care | Payer: Federal, State, Local not specified - PPO | Source: Ambulatory Visit | Attending: Orthopedic Surgery | Admitting: Orthopedic Surgery

## 2016-07-11 DIAGNOSIS — M5412 Radiculopathy, cervical region: Secondary | ICD-10-CM

## 2016-11-12 ENCOUNTER — Telehealth: Payer: Self-pay | Admitting: Internal Medicine

## 2016-11-12 DIAGNOSIS — G4733 Obstructive sleep apnea (adult) (pediatric): Secondary | ICD-10-CM

## 2016-11-12 NOTE — Telephone Encounter (Addendum)
Called spoke with patient who reported his VPAP machine is no longer working properly - the heating mechanism for the H2O isn't working and it's drying his mouth out.  He did take the machine to his DME company, but they cannot repair the machine because that make/model is no longer being made.  Last ov 09/2015 w/ CY >> appt scheduled for 4.23.18 @ 1030 (pt will bring his SD card to the appt) Dr. Maple Hudson, in the meantime may pt have an order for a new VPAP machine?  Thank you.

## 2016-11-12 NOTE — Telephone Encounter (Signed)
Ok order DME [facility]  Replace malfunctioning VPAP 15/15, mask of choice, humidifier, supplies, AirView    Dx OSA

## 2016-11-12 NOTE — Telephone Encounter (Signed)
Order for replacement VPAP machine sent to [facility] spoke with patient, informed him that order has been sent to [facility] for replacement machine Pt did ask how long this typically takes - unfortunately we are unable to assist with a timeline.  Advised pt to wait a couple of hours for [facility] to receive the order, then call the DME company to see if they can provide an estimate.  Pt okay with this and voiced his understanding.  He will keep his 4.23.18 appt with CY.  Nothing further needed; will sign off.

## 2016-11-16 ENCOUNTER — Encounter: Payer: Self-pay | Admitting: Internal Medicine

## 2016-11-16 ENCOUNTER — Ambulatory Visit (INDEPENDENT_AMBULATORY_CARE_PROVIDER_SITE_OTHER): Payer: Federal, State, Local not specified - PPO | Admitting: Internal Medicine

## 2016-11-16 VITALS — BP 130/68 | HR 82 | Ht 68.0 in | Wt 309.0 lb

## 2016-11-16 DIAGNOSIS — F5101 Primary insomnia: Secondary | ICD-10-CM

## 2016-11-16 DIAGNOSIS — J4520 Mild intermittent asthma, uncomplicated: Secondary | ICD-10-CM

## 2016-11-16 DIAGNOSIS — J302 Other seasonal allergic rhinitis: Secondary | ICD-10-CM | POA: Diagnosis not present

## 2016-11-16 DIAGNOSIS — J3089 Other allergic rhinitis: Secondary | ICD-10-CM | POA: Diagnosis not present

## 2016-11-16 DIAGNOSIS — G4733 Obstructive sleep apnea (adult) (pediatric): Secondary | ICD-10-CM

## 2016-11-16 DIAGNOSIS — G4700 Insomnia, unspecified: Secondary | ICD-10-CM | POA: Insufficient documentation

## 2016-11-16 MED ORDER — DOXYCYCLINE HYCLATE 100 MG PO TABS: 100.0000 mg | ORAL_TABLET | Freq: Two times a day (BID) | ORAL | 0 refills | Status: DC

## 2016-11-16 MED ORDER — TRAZODONE HCL 50 MG PO TABS: ORAL_TABLET | ORAL | 99 refills | Status: AC

## 2016-11-16 NOTE — Assessment & Plan Note (Signed)
Pattern has not changed. He needs trazodone at times. Sleep hygiene reviewed. His wife also helps keep him on track. Plan-refill trazodone

## 2016-11-16 NOTE — Assessment & Plan Note (Signed)
Suspect early bilateral maxillary acute sinusitis Plan-doxycycline and saline rinse

## 2016-11-16 NOTE — Patient Instructions (Addendum)
Script sent for doxycycline antibiotic  Script sent refilling trazodone  Order- DME [facility] ( [place])- replacement for old VPAP machine 14/14, mask of choice, humidifier, supplies, AirView   Dx OSA  Suggest otc nasal saline rinse, such as NeilMed squeeze bottle, used gently once or twice daily while needed to help clear your nose.  Please call as needed

## 2016-11-16 NOTE — Assessment & Plan Note (Signed)
No recent complication. Mild intermittent well-controlled. No changes needed.

## 2016-11-16 NOTE — Progress Notes (Signed)
Subjective:    Patient ID: [name] [name], male    DOB: 01-13-1966, 51 y.o.   MRN: [id number]  HPI male never smoker followed for OSA, allergic rhinitis,Asthma, complicated by morbid obesity, GERD, chronic RLE edema  years NPSG 117/07-AHI 53.9/hour, desaturation to 77%, body weight 330 pounds   --------------------------------------------------------------------------------------------------  04/02/15-49 yoM followed for sleep apnea and allergic rhinitis, complicated by morbid obesity and hx of GERD, chronic RLE edema x years VPAP 14/14 or 15/15 [facility]  follows for: pt states he has no issue faslling alseep he just can not stay asleep. pt using VPAP everynight for about 5 hours. mask and pressure is okay. DME: [facility]. Chronic repeated waking after sleep onset. May get drowsy in chair after work but states ok driving. Occ tea or pepsi.  Stopped trazodone- 50 mg didn't help sleep quality.  09/30/2015-51 year old male never smoker followed for OSA, allergic rhinitis, complicated by morbid obesity, GERD, chronic RLE edema  years VPAP 14/14  [facility] FOLLOWS FOR: Pt wears VPAP every night for about 6 hours; DME is [facility]. DL attached.  Download supports his report that he uses CPAP every night and does very well. Back pain has been relieved by surgery and no longer interferes with sleep  11/16/16- 51 year old male never smoker followed for OSA, allergic rhinitis, asthma, complicated by morbid obesity, GERD, chronic RLE edema  years,  VPAP 14/14  [facility] 1 yr f/u for OSA. Uses [facility] as DME. Needs a new machine.  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 He was having nasal and sinus discomfort before hospital, blamed on pollen. Humidifier has not been working on his machine so he avoided using it for fear would make his rhinitis worse. He thinks he has sinus infection now.  DME has  told him the machine cannot be repaired. Download had shown 96% 4 hour compliance with AHI 4.8/hour. No significant wheezing or need for rescue inhaler. Continues to use trazodone when needed to help insomnia and requests refill.    Review of Systems-See HPI Constitutional:   No-   weight loss, + sweats, fevers, chills, fatigue, lassitude. HEENT:   No-  headaches, difficulty swallowing, tooth/dental problems, sore throat,       No-  sneezing, itching, ear ache, +nasal congestion, +post nasal drip,  CV:  chest pain, orthopnea, PND, swelling in lower extremities, anasarca, dizziness, palpitations Resp: No-   shortness of breath with exertion or at rest.          productive cough,  +non-productive cough,  No- coughing up of blood.             No-change in color of mucus.  No- wheezing.   Skin: No-   rash or lesions. GI:  No-   heartburn, indigestion, abdominal pain, nausea, vomiting,  GU:  MS:  Left low back and hip pain Neuro-     nothing unusual Psych:  No- change in mood or affect. No depression or anxiety.  No memory loss.  Objective:   Physical Exam General- Alert, Oriented, Affect-appropriate, Distress- none acute, + morbidly obese Skin- rash-none, lesions- none, excoriation- none Lymphadenopathy- none Head- atraumatic            Eyes- Gross vision intact, PERRLA, conjunctivae clear secretions            Ears- Hearing, canals-normal            Nose- + Turbinate edema, no-Septal dev,  mucus, polyps, erosion, perforation             Throat- Mallampati III-IV , mucosa obscured, drainage- none, tonsils- atrophic Neck- flexible , trachea midline, no stridor , thyroid nl, carotid no bruit Chest - symmetrical excursion , unlabored           Heart/CV- RRR , no murmur , no gallop  , no rub, nl s1 s2                           - JVD- none , edema+R leg chronic, stasis changes- none, varices- none           Lung- clear, wheeze- none, cough- none , dullness-none, rub- none           Chest  wall-  Abd-  Br/ Gen/ Rectal- Not done, not indicated Extrem- cyanosis- none, clubbing, none, atrophy- none, strength- nl, +cane Neuro- grossly intact to observation

## 2016-11-16 NOTE — Assessment & Plan Note (Signed)
Humidifier has failed on his machine sharply impacted comfortable compliance. Plan-replacement for old machine to continue VPAP 14/14 since this gives good control and he is comfortable with it.

## 2016-11-16 NOTE — Assessment & Plan Note (Signed)
His chronic obesity impacts many of his medical problems as discussed. Bariatric referral available when he chooses.

## 2017-11-16 ENCOUNTER — Ambulatory Visit: Payer: Federal, State, Local not specified - PPO | Admitting: Internal Medicine

## 2017-12-10 ENCOUNTER — Encounter: Payer: Self-pay | Admitting: Gastroenterology

## 2021-12-18 ENCOUNTER — Other Ambulatory Visit: Payer: Self-pay

## 2021-12-18 ENCOUNTER — Emergency Department (HOSPITAL_BASED_OUTPATIENT_CLINIC_OR_DEPARTMENT_OTHER)
Admission: EM | Admit: 2021-12-18 | Discharge: 2021-12-18 | Disposition: A | Discharge status: Home / Self Care | Payer: Federal, State, Local not specified - PPO | Attending: Emergency Medicine | Admitting: Emergency Medicine

## 2021-12-18 ENCOUNTER — Encounter (HOSPITAL_BASED_OUTPATIENT_CLINIC_OR_DEPARTMENT_OTHER): Payer: Self-pay | Admitting: Emergency Medicine

## 2021-12-18 ENCOUNTER — Emergency Department (HOSPITAL_BASED_OUTPATIENT_CLINIC_OR_DEPARTMENT_OTHER): Payer: Federal, State, Local not specified - PPO

## 2021-12-18 DIAGNOSIS — M79605 Pain in left leg: Secondary | ICD-10-CM | POA: Diagnosis present

## 2021-12-18 DIAGNOSIS — L03116 Cellulitis of left lower limb: Secondary | ICD-10-CM | POA: Insufficient documentation

## 2021-12-18 DIAGNOSIS — W5503XA Scratched by cat, initial encounter: Secondary | ICD-10-CM | POA: Insufficient documentation

## 2021-12-18 DIAGNOSIS — Z23 Encounter for immunization: Secondary | ICD-10-CM | POA: Insufficient documentation

## 2021-12-18 DIAGNOSIS — J45909 Unspecified asthma, uncomplicated: Secondary | ICD-10-CM | POA: Diagnosis not present

## 2021-12-18 MED ORDER — AMOXICILLIN-POT CLAVULANATE 875-125 MG PO TABS
1.0000 | ORAL_TABLET | Freq: Once | ORAL | Status: AC
Start: 2021-12-18 — End: 2021-12-18
  Administered 2021-12-18: 1 via ORAL
  Filled 2021-12-18: qty 1

## 2021-12-18 MED ORDER — AMOXICILLIN-POT CLAVULANATE 875-125 MG PO TABS: 1.0000 | ORAL_TABLET | Freq: Two times a day (BID) | ORAL | 0 refills | Status: AC

## 2021-12-18 MED ORDER — TETANUS-DIPHTH-ACELL PERTUSSIS 5-2.5-18.5 LF-MCG/0.5 IM SUSY
0.5000 mL | PREFILLED_SYRINGE | Freq: Once | INTRAMUSCULAR | Status: AC
  Administered 2021-12-18: 0.5 mL via INTRAMUSCULAR
  Filled 2021-12-18: qty 0.5

## 2021-12-18 NOTE — ED Provider Notes (Signed)
[facility] EMERGENCY DEPARTMENT Provider Note   CSN: [id number] Arrival date & time: 12/18/21  1659     History  Chief Complaint  Patient presents with   Leg Pain    [name] [name] is a 56 y.o. male.  The history is provided by the patient and medical records. No language interpreter was used.  Leg Pain Location:  Leg Time since incident:  4 days Lower extremity injury: possible cat bite/scratches/bamboo scratches, or chicken scratches.   Pain details:    Quality:  Aching   Radiates to:  Does not radiate   Severity:  Moderate   Onset quality:  Gradual   Duration:  4 days   Timing:  Constant   Progression:  Worsening Chronicity:  New Foreign body present:  No foreign bodies Prior injury to area:  No Relieved by:  Nothing Worsened by:  Nothing Ineffective treatments:  None tried Associated symptoms: swelling   Associated symptoms: no back pain, no fatigue, no fever, no itching, no muscle weakness, no neck pain, no numbness, no stiffness and no tingling       Home Medications Prior to Admission medications   Medication Sig Start Date End Date Taking? Authorizing Provider  allopurinol (ZYLOPRIM) 100 MG tablet Take 100 mg by mouth. 10/26/14 10/26/15  [provider]  carvedilol (COREG) 6.25 MG tablet Take 6.25 mg by mouth.    [provider]  doxycycline (VIBRA-TABS) 100 MG tablet Take 1 tablet (100 mg total) by mouth 2 (two) times daily. 11/16/16   Jetty Duhamel D, MD  hydrochlorothiazide (HYDRODIURIL) 50 MG tablet Take 25 mg by mouth.    [provider]  metFORMIN (GLUCOPHAGE-XR) 500 MG 24 hr tablet Take 500 mg by mouth. 11/09/16 12/09/16  [provider]  Multiple Vitamin (MULTIVITAMIN) tablet Take 1 tablet by mouth daily.    [provider]  simvastatin (ZOCOR) 80 MG tablet Take 80 mg by mouth daily.    [provider]  traZODone (DESYREL) 50 MG tablet 1 or 2 at bedtime for sleep 11/16/16   Waymon Budge, MD       Allergies    Patient has no known allergies.    Review of Systems   Review of Systems  Constitutional:  Negative for chills, fatigue and fever.  HENT:  Negative for congestion.   Respiratory:  Negative for shortness of breath.   Gastrointestinal:  Negative for abdominal pain, constipation, diarrhea, nausea and vomiting.  Genitourinary:  Negative for dysuria.  Musculoskeletal:  Negative for back pain, neck pain, neck stiffness and stiffness.  Skin:  Positive for rash. Negative for itching.  Neurological:  Negative for weakness, light-headedness and headaches.  Psychiatric/Behavioral:  Negative for agitation and confusion.   All other systems reviewed and are negative.  Physical Exam Updated Vital Signs BP (!) 130/59 (BP Location: Right Arm)   Pulse 93   Temp (!) 97.5 F (36.4 C) (Oral)   Resp 18   Ht  (1.727 m)   Wt 132 kg   SpO2 96%   BMI 44.25 kg/m  Physical Exam Vitals and nursing note reviewed.  Constitutional:      General: He is not in acute distress.    Appearance: He is well-developed. He is not ill-appearing, toxic-appearing or diaphoretic.  HENT:     Head: Normocephalic and atraumatic.     Nose: Nose normal.     Mouth/Throat:     Mouth: Mucous membranes are moist.  Eyes:     Extraocular  Movements: Extraocular movements intact.     Conjunctiva/sclera: Conjunctivae normal.     Pupils: Pupils are equal, round, and reactive to light.  Cardiovascular:     Rate and Rhythm: Normal rate and regular rhythm.     Heart sounds: No murmur heard. Pulmonary:     Effort: Pulmonary effort is normal. No respiratory distress.     Breath sounds: Normal breath sounds. No wheezing, rhonchi or rales.  Chest:     Chest wall: No tenderness.  Abdominal:     General: Abdomen is flat.     Palpations: Abdomen is soft.     Tenderness: There is no abdominal tenderness. There is no right CVA tenderness, left CVA tenderness, guarding or rebound.  Musculoskeletal:         General: Swelling and tenderness present.     Cervical back: Neck supple.     Right lower leg: Edema present.     Left lower leg: Edema present.     Comments: Patient has some tenderness and erythema on the left shin and calf.  There are some scratches but no large laceration seen.  No fluctuance or crepitance.  Tenderness does not extend past the area of redness.  Normal sensation and strength and pulses.  No tenderness in the knee or hip.no purulence seen.   Skin:    General: Skin is warm and dry.     Capillary Refill: Capillary refill takes less than 2 seconds.     Findings: Erythema and rash present.  Neurological:     General: No focal deficit present.     Mental Status: He is alert.     Sensory: No sensory deficit.     Motor: No weakness.  Psychiatric:        Mood and Affect: Mood normal.       ED Results / Procedures / Treatments   Labs (all labs ordered are listed, but only abnormal results are displayed) Labs Reviewed - No data to display  EKG None  Radiology US Venous Img Lower Unilateral Left  Result Date: 12/18/2021 CLINICAL DATA:  Left leg pain. EXAM: LEFT LOWER EXTREMITY VENOUS DOPPLER ULTRASOUND TECHNIQUE: Gray-scale sonography with compression, as well as color and duplex ultrasound, were performed to evaluate the deep venous system(s) from the level of the common femoral vein through the popliteal and proximal calf veins. COMPARISON:  None Available. FINDINGS: VENOUS Normal compressibility of the common femoral, superficial femoral, and popliteal veins, as well as the visualized calf veins. Visualized portions of profunda femoral vein and great saphenous vein unremarkable. No filling defects to suggest DVT on grayscale or color Doppler imaging. Doppler waveforms show normal direction of venous flow, normal respiratory plasticity and response to augmentation. Limited views of the contralateral common femoral vein are unremarkable. OTHER None. Limitations: none  IMPRESSION: Negative. Electronically Signed   By: Sebastian AcheAllen  Grady M.D.   On: 12/18/2021 18:06    Procedures Procedures    Medications Ordered in ED Medications  amoxicillin-clavulanate (AUGMENTIN) 875-125 MG per tablet 1 tablet (has no administration in time range)    ED Course/ Medical Decision Making/ A&P                           Medical Decision Making   Clide DalesBobby Matchett is a 56 y.o. male with a past medical history significant for asthma, GERD, sleep apnea, and chronic right leg edema who presents with pain, swelling, and redness of left lower leg.  Patient  reports that he has had some redness and discomfort and swelling in his left lower leg from the knee down for the last few days.  He went to an urgent care and was told to come here for DVT rule out.  He says that he does have many scratches on his legs chronically due to being around cats and chickens.  He reports that his right leg is always swollen for years but his left leg is slightly more swollen today.  He otherwise denies any fevers, chills, lymphadenopathy, knee pain, hip pain, chest pain, cough, shortness of breath, nausea, vomiting, constipation, diarrhea.  He denies any numbness, tingling, or weakness of extremities.  He denies any previous diagnosis of a DVT but thought he could have had a DVT in the past on the right leg after a trip to [place] that he never got evaluated completely.  On exam, lungs clear and chest nontender.  Abdomen nontender.  Back and flanks nontender.  Hip nontender.  Knee nontender.  Patient has erythema and redness in the left shin and calf with some swelling and warmth.  There is no crepitance appreciated.  The tenderness did not spread past the area of erythema.  There are scratches in the shins near where the redness is present.  Distally he had intact sensation, strength, and pulses.  We will get ultrasound to rule out DVT however given the skin injury from the scratches and the erythema and warmth I am  more suspicious for cellulitis at this time.  Given the possibility of it being from a cat scratch, anticipate tailoring antibiotics to cover for different types of infections including ones from the cat.  As he does not clinically appear ill or evidence of systemic infection, will hold on lab work.  As there is no trauma and he is not having bony tenderness, will hold on x-ray initially.  Low suspicion for a gas-forming infection with on exam.  Anticipate reassessment after ultrasound to determine disposition.  If ultrasound is reassuring, dissipate discharge with antibiotics and patient agrees with this plan  7:07 PM Ultrasound did not show evidence of DVT.  Due to the possibility of cat bite or scratch contributing to the cellulitis, I spoke to pharmacy who recommended Augmentin as the antibiotic of choice to treat.  Patient was given a dose here and will take the medication and follow-up with a primary doctor.  He understands extremely strict return precautions for any new or worsened symptoms as to return and at that point would possibly need IV antibiotics, labs, and admission.  I do not feel he needs it at this time.  Patient is unsure of his last tetanus shot, will update Tdap.  Patient agrees.  Patient be discharged for outpatient follow-up.        Final Clinical Impression(s) / ED Diagnoses Final diagnoses:  Cellulitis of left lower extremity  Cat scratch of left lower leg, initial encounter    Rx / DC Orders ED Discharge Orders          Ordered    amoxicillin-clavulanate (AUGMENTIN) 875-125 MG tablet  Every 12 hours        12/18/21 1906            Clinical Impression: 1. Cellulitis of left lower extremity   2. Cat scratch of left lower leg, initial encounter     Disposition: Discharge  Condition: Good  I have discussed the results, Dx and Tx plan with the pt(& family if present). He/she/they expressed  understanding and agree(s) with the plan. Discharge instructions  discussed at great length. Strict return precautions discussed and pt &/or family have verbalized understanding of the instructions. No further questions at time of discharge.    New Prescriptions   AMOXICILLIN-CLAVULANATE (AUGMENTIN) 875-125 MG TABLET    Take 1 tablet by mouth every 12 (twelve) hours for 10 days.    Follow Up: Nonnie Done., MD [address] [place] [address] [phone]     [facility] [address] [id number] [place] [facility] [address] [phone]        , Canary Brim, MD 12/18/21 [phone]

## 2021-12-18 NOTE — Discharge Instructions (Signed)
Your history, exam, evaluation today did not show evidence of any blood clot on the ultrasound but is concerning for cellulitis.  I spoke with pharmacy given the possible cat scratch or chicken scratches contributing to the cellulitis and they felt that Augmentin was the best antibiotic to treat you today.  Please take this for the next 10 days and follow-up with your primary doctor.  Please rest and stay hydrated.  If any symptoms change or worsen significantly, please return to the nearest emergency department.

## 2021-12-18 NOTE — ED Triage Notes (Signed)
Pt report left calf pain, swelling and redness since Monday. Seen at [facility] today and told to come to ED for [place] to r/o blood clot. Denies injury. Also endorses increased fatigue over the past month.

## 2022-10-27 IMAGING — US US EXTREM LOW VENOUS*L*
1 series · 14 of 24 positions shown · non-contrast
Comparison: None Available.

CLINICAL DATA: Left leg pain.

EXAM:
LEFT LOWER EXTREMITY VENOUS DOPPLER ULTRASOUND
TECHNIQUE: Gray-scale sonography with compression, as well as color and duplex
ultrasound, were performed to evaluate the deep venous system(s)
from the level of the common femoral vein through the popliteal and
proximal calf veins.

[Series 1: us extrem low venous*left* · 14 of 29 slices shown]
[im 1/29]
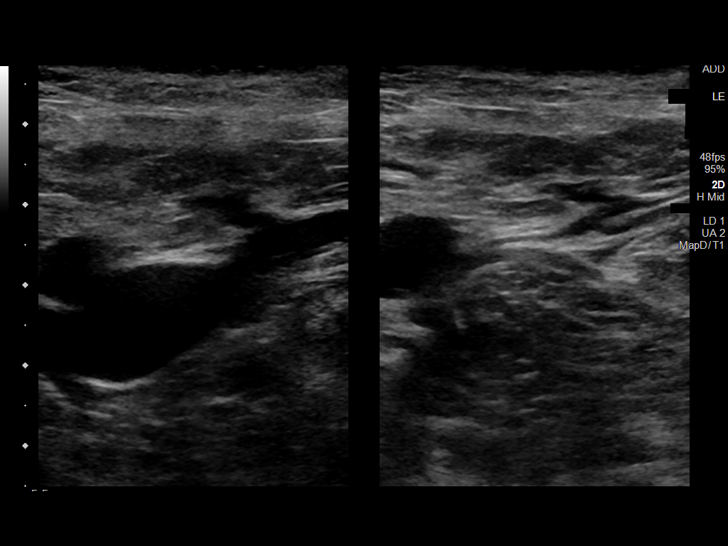
[im 3/29]
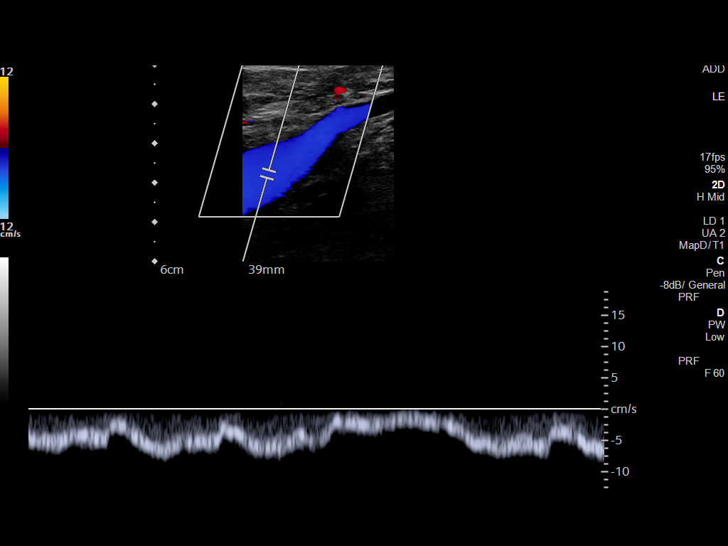
[im 5/29]
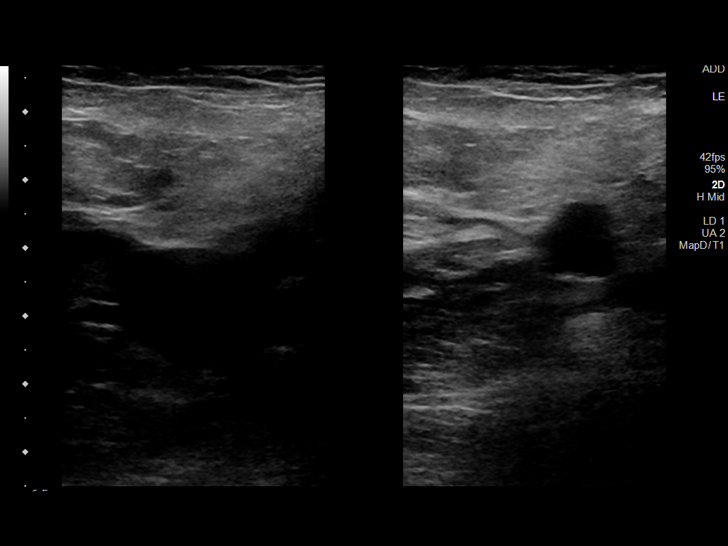
[im 8/29]
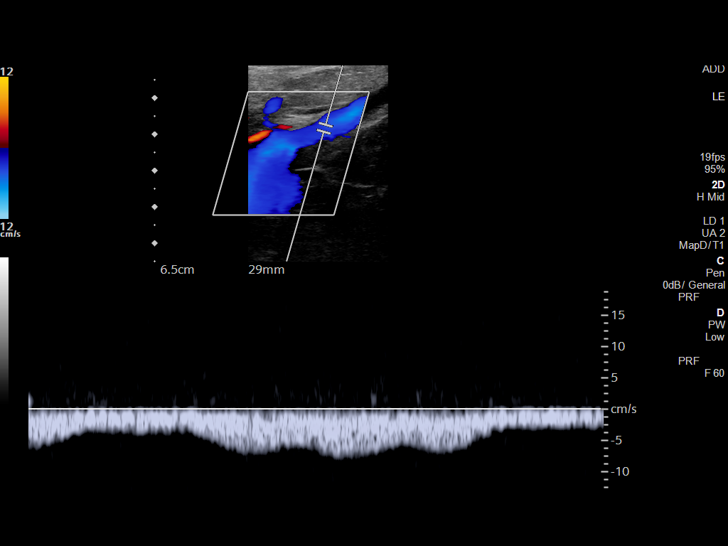
[im 9/29]
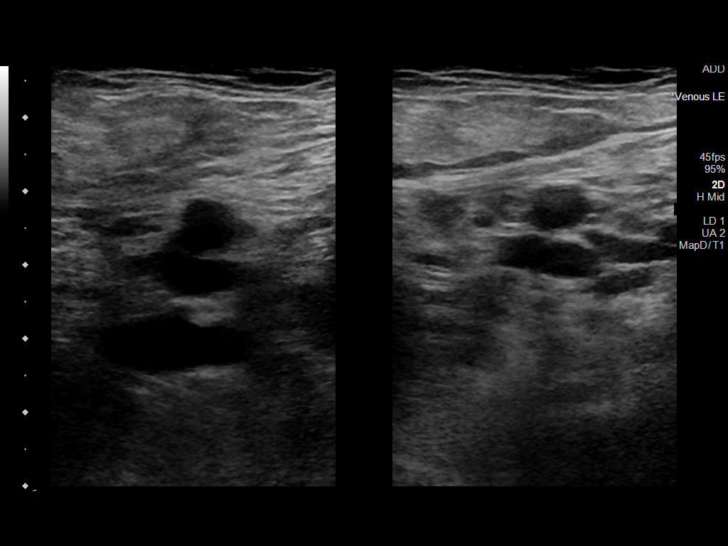
[im 11/29]
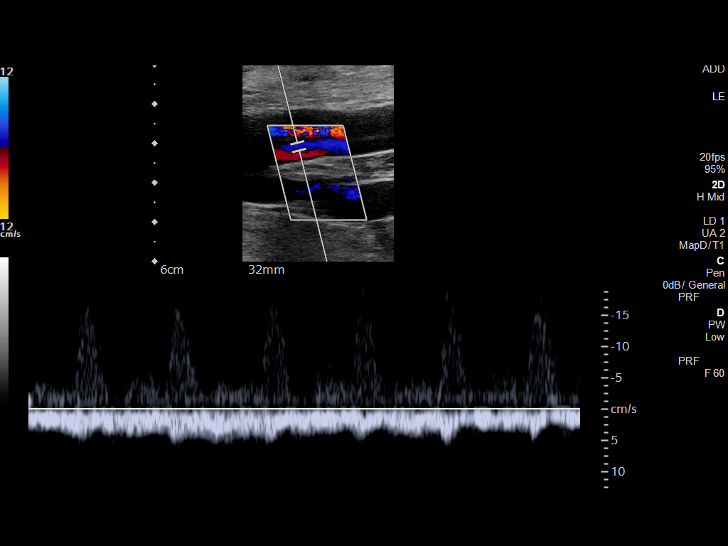
[im 14/29]
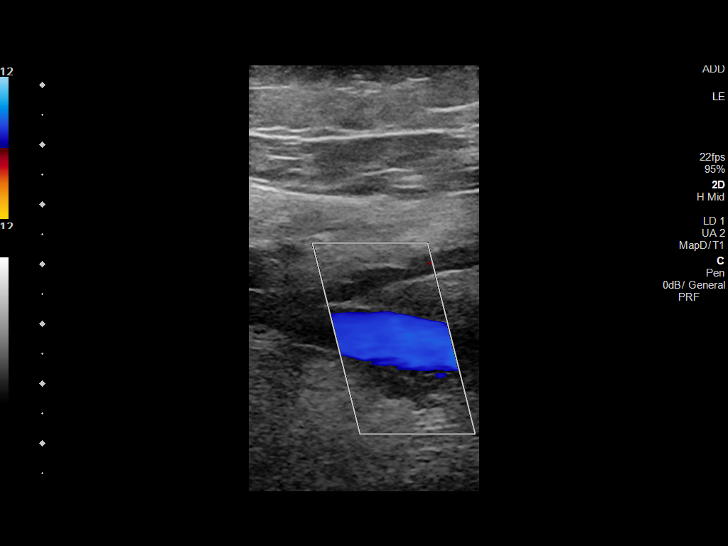
[im 15/29]
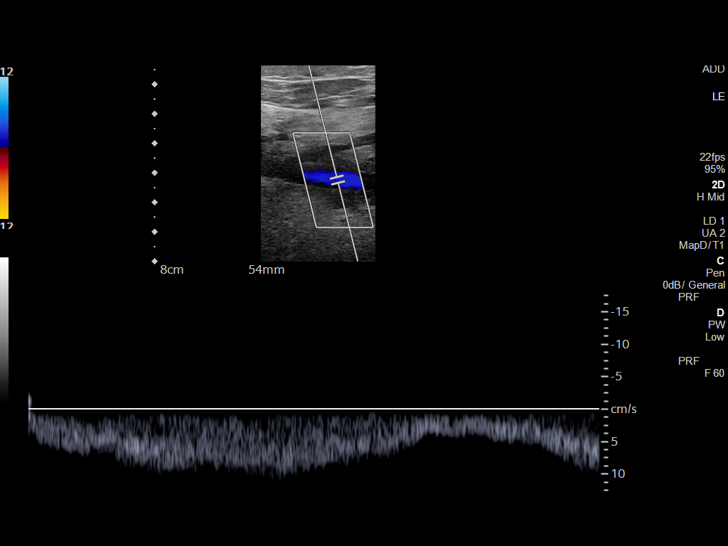
[im 18/29]
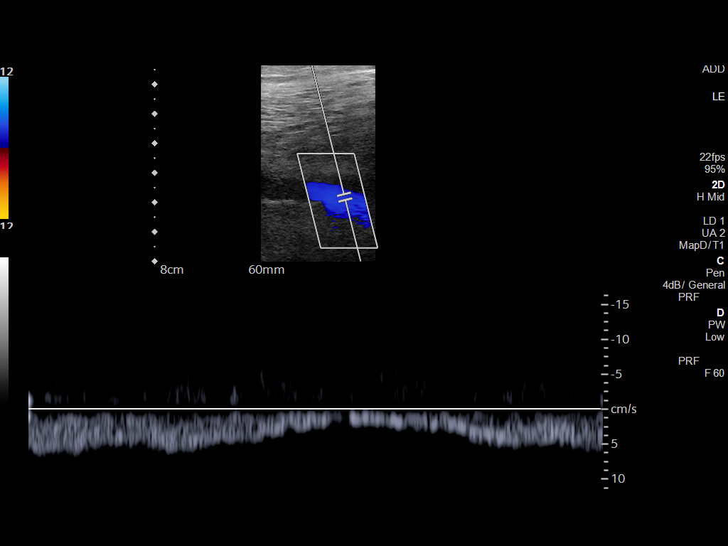
[im 20/29]
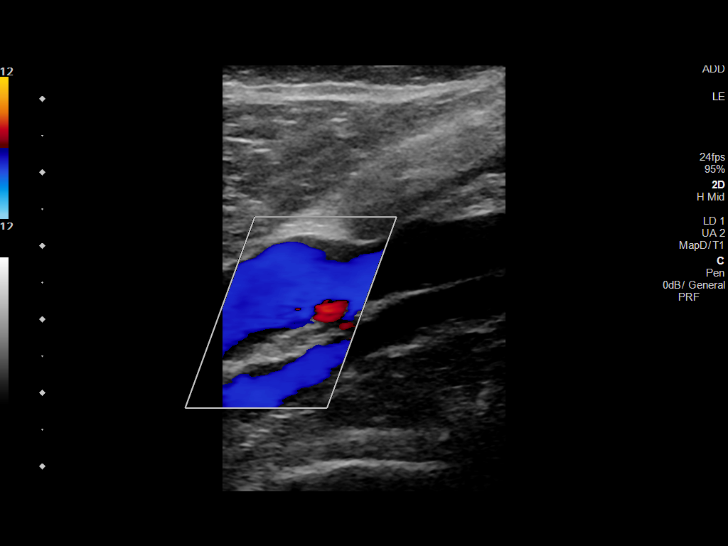
[im 22/29]
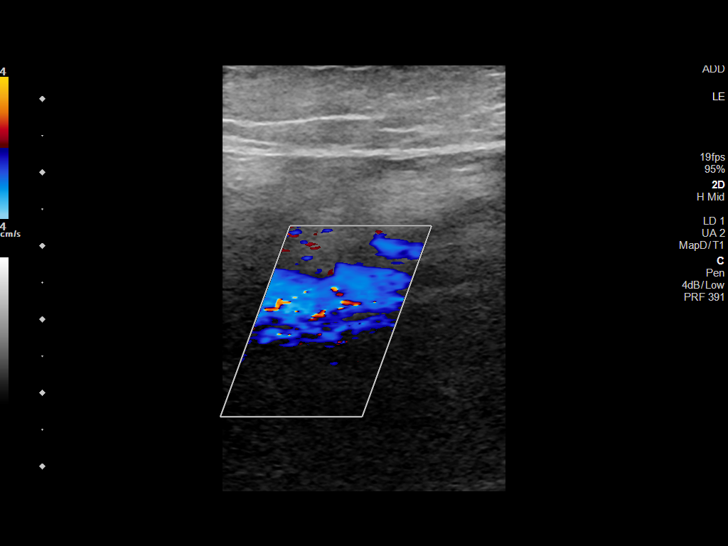
[im 24/29]
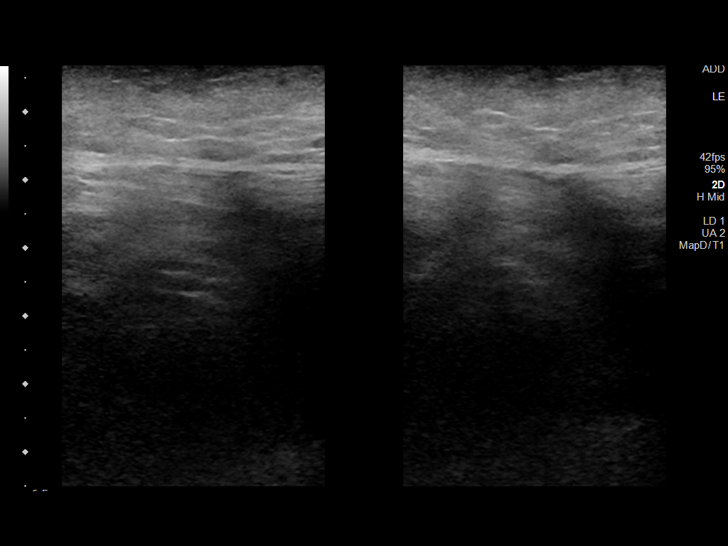
[im 26/29]
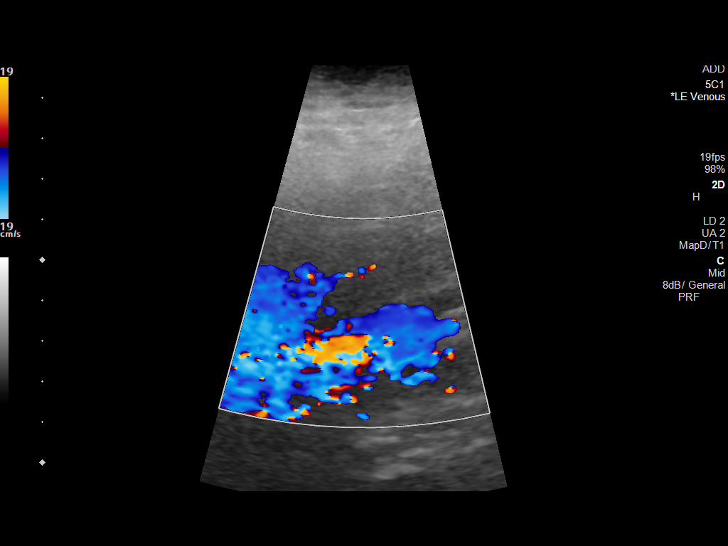
[im 29/29]
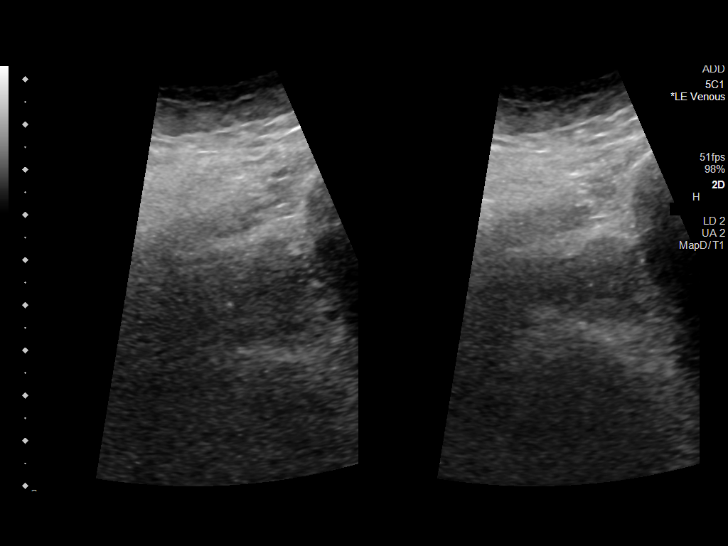

[14 of 24 positions shown; findings below may reference images not displayed]

FINDINGS: VENOUS

Normal compressibility of the common femoral, superficial femoral,
and popliteal veins, as well as the visualized calf veins.
Visualized portions of profunda femoral vein and great saphenous
vein unremarkable. No filling defects to suggest DVT on grayscale or
color Doppler imaging. Doppler waveforms show normal direction of
venous flow, normal respiratory plasticity and response to
augmentation.

Limited views of the contralateral common femoral vein are
unremarkable.

OTHER

None.

Limitations: none
IMPRESSION: Negative.

## 2023-03-15 ENCOUNTER — Encounter (HOSPITAL_BASED_OUTPATIENT_CLINIC_OR_DEPARTMENT_OTHER): Payer: Self-pay

## 2023-03-15 ENCOUNTER — Emergency Department (HOSPITAL_BASED_OUTPATIENT_CLINIC_OR_DEPARTMENT_OTHER)
Admission: EM | Admit: 2023-03-15 | Discharge: 2023-03-16 | Disposition: A | Discharge status: Home / Self Care | Payer: Federal, State, Local not specified - PPO | Attending: Emergency Medicine | Admitting: Emergency Medicine

## 2023-03-15 ENCOUNTER — Emergency Department (HOSPITAL_BASED_OUTPATIENT_CLINIC_OR_DEPARTMENT_OTHER): Payer: Federal, State, Local not specified - PPO

## 2023-03-15 ENCOUNTER — Other Ambulatory Visit: Payer: Self-pay

## 2023-03-15 DIAGNOSIS — R197 Diarrhea, unspecified: Secondary | ICD-10-CM | POA: Diagnosis not present

## 2023-03-15 DIAGNOSIS — N131 Hydronephrosis with ureteral stricture, not elsewhere classified: Secondary | ICD-10-CM | POA: Diagnosis not present

## 2023-03-15 DIAGNOSIS — N200 Calculus of kidney: Secondary | ICD-10-CM

## 2023-03-15 DIAGNOSIS — R109 Unspecified abdominal pain: Secondary | ICD-10-CM | POA: Diagnosis present

## 2023-03-15 LAB — URINALYSIS, ROUTINE W REFLEX MICROSCOPIC
Bilirubin Urine: NEGATIVE
Glucose, UA: 100 mg/dL — AB
Ketones, ur: NEGATIVE mg/dL
Leukocytes,Ua: NEGATIVE
Nitrite: NEGATIVE
Protein, ur: 100 mg/dL — AB
Specific Gravity, Urine: >=1.03 (ref 1.005–1.030)
pH: 5.5 (ref 5.0–8.0)

## 2023-03-15 LAB — COMPREHENSIVE METABOLIC PANEL
ALT: 30 U/L (ref 0–44)
AST: 28 U/L (ref 15–41)
Albumin: 3.7 g/dL (ref 3.5–5.0)
Alkaline Phosphatase: 77 U/L (ref 38–126)
Anion gap: 9 (ref 5–15)
BUN: 18 mg/dL (ref 6–20)
CO2: 24 mmol/L (ref 22–32)
Calcium: 8.3 mg/dL — ABNORMAL LOW (ref 8.9–10.3)
Chloride: 106 mmol/L (ref 98–111)
Creatinine, Ser: 0.81 mg/dL (ref 0.61–1.24)
GFR, Estimated: >60 mL/min (ref >60)
Glucose, Bld: 200 mg/dL — ABNORMAL HIGH (ref 70–99)
Potassium: 3.2 mmol/L — ABNORMAL LOW (ref 3.5–5.1)
Sodium: 139 mmol/L (ref 135–145)
Total Bilirubin: 1.4 mg/dL — ABNORMAL HIGH (ref 0.3–1.2)
Total Protein: 7 g/dL (ref 6.5–8.1)

## 2023-03-15 LAB — CBC
HCT: 37.6 % — ABNORMAL LOW (ref 39.0–52.0)
Hemoglobin: 12.8 g/dL — ABNORMAL LOW (ref 13.0–17.0)
MCH: 28.3 pg (ref 26.0–34.0)
MCHC: 34 g/dL (ref 30.0–36.0)
MCV: 83 fL (ref 80.0–100.0)
Platelets: 224 10*3/uL (ref 150–400)
RBC: 4.53 MIL/uL (ref 4.22–5.81)
RDW: 13.8 % (ref 11.5–15.5)
WBC: 7.8 10*3/uL (ref 4.0–10.5)
nRBC: 0 % (ref 0.0–0.2)

## 2023-03-15 LAB — URINALYSIS, MICROSCOPIC (REFLEX): WBC, UA: NONE SEEN WBC/hpf (ref 0–5)

## 2023-03-15 LAB — LIPASE, BLOOD: Lipase: 22 U/L (ref 11–51)

## 2023-03-15 MED ORDER — IOHEXOL 300 MG/ML  SOLN
125.0000 mL | Freq: Once | INTRAMUSCULAR | Status: AC | PRN
  Administered 2023-03-15: 125 mL via INTRAVENOUS

## 2023-03-15 NOTE — ED Triage Notes (Signed)
ABD pain and diarrhea since Thursday

## 2023-03-16 MED ORDER — ONDANSETRON HCL 4 MG/2ML IJ SOLN
4.0000 mg | Freq: Once | INTRAMUSCULAR | Status: AC
  Administered 2023-03-16: 4 mg via INTRAVENOUS
  Filled 2023-03-16: qty 2

## 2023-03-16 MED ORDER — SODIUM CHLORIDE 0.9 % IV BOLUS
1000.0000 mL | Freq: Once | INTRAVENOUS | Status: AC
Start: 2023-03-16 — End: 2023-03-16
  Administered 2023-03-16: 1000 mL via INTRAVENOUS

## 2023-03-16 MED ORDER — ONDANSETRON 8 MG PO TBDP: ORAL_TABLET | ORAL | 0 refills | Status: AC

## 2023-03-16 MED ORDER — KETOROLAC TROMETHAMINE 30 MG/ML IJ SOLN
15.0000 mg | Freq: Once | INTRAMUSCULAR | Status: AC
  Administered 2023-03-16: 15 mg via INTRAVENOUS
  Filled 2023-03-16: qty 1

## 2023-03-16 MED ORDER — TAMSULOSIN HCL 0.4 MG PO CAPS: 0.4000 mg | ORAL_CAPSULE | Freq: Every day | ORAL | 0 refills | Status: AC

## 2023-03-16 NOTE — ED Provider Notes (Signed)
[place] [facility] Provider Note   CSN: [id number] Arrival date & time: 03/15/23  1947     History  Chief Complaint  Patient presents with   Abdominal Pain    [name] [name] is a 57 y.o. male.  The history is provided by the patient.  Diarrhea Quality:  Watery Severity:  Severe Onset quality:  Gradual Duration:  4 days Timing:  Intermittent Progression:  Unchanged Relieved by:  Nothing Worsened by:  Nothing Ineffective treatments:  None tried Associated symptoms: no fever, no URI and no vomiting   Risk factors: no recent antibiotic use        Home Medications Prior to Admission medications   Medication Sig Start Date End Date Taking? Authorizing Provider  ondansetron (ZOFRAN-ODT) 8 MG disintegrating tablet 8mg  ODT q8 hours prn nausea 03/16/23  Yes Heli Dino, MD  tamsulosin (FLOMAX) 0.4 MG CAPS capsule Take 1 capsule (0.4 mg total) by mouth daily. 03/16/23  Yes Whittany Parish, MD  allopurinol (ZYLOPRIM) 100 MG tablet Take 100 mg by mouth. 10/26/14 10/26/15  [provider]  carvedilol (COREG) 6.25 MG tablet Take 6.25 mg by mouth.    [provider]  doxycycline (VIBRA-TABS) 100 MG tablet Take 1 tablet (100 mg total) by mouth 2 (two) times daily. 11/16/16   Jetty Duhamel D, MD  hydrochlorothiazide (HYDRODIURIL) 50 MG tablet Take 25 mg by mouth.    [provider]  metFORMIN (GLUCOPHAGE-XR) 500 MG 24 hr tablet Take 500 mg by mouth. 11/09/16 12/09/16  [provider]  Multiple Vitamin (MULTIVITAMIN) tablet Take 1 tablet by mouth daily.    [provider]  simvastatin (ZOCOR) 80 MG tablet Take 80 mg by mouth daily.    [provider]  traZODone (DESYREL) 50 MG tablet 1 or 2 at bedtime for sleep 11/16/16   Waymon Budge, MD      Allergies    Patient has no known allergies.    Review of Systems   Review of Systems  Constitutional:  Negative for fever.  HENT:  Negative for facial  swelling.   Eyes:  Negative for redness.  Respiratory:  Negative for wheezing and stridor.   Gastrointestinal:  Positive for diarrhea. Negative for vomiting.  All other systems reviewed and are negative.   Physical Exam Updated Vital Signs BP 137/71   Pulse 75   Temp 98.1 F (36.7 C) (Oral)   Resp 18   Ht 5\' 8"  (1.727 m)   Wt 128.4 kg   SpO2 96%   BMI 43.03 kg/m  Physical Exam Vitals and nursing note reviewed.  Constitutional:      General: He is not in acute distress.    Appearance: He is well-developed. He is not diaphoretic.  HENT:     Head: Normocephalic and atraumatic.     Nose: Nose normal.  Eyes:     Conjunctiva/sclera: Conjunctivae normal.     Pupils: Pupils are equal, round, and reactive to light.  Cardiovascular:     Rate and Rhythm: Normal rate and regular rhythm.     Pulses: Normal pulses.     Heart sounds: Normal heart sounds.  Pulmonary:     Effort: Pulmonary effort is normal.     Breath sounds: Normal breath sounds. No wheezing or rales.  Abdominal:     General: Bowel sounds are normal.     Palpations: Abdomen is soft.     Tenderness: There is no abdominal tenderness. There is no guarding or  rebound.     Hernia: No hernia is present.  Musculoskeletal:        General: Normal range of motion.     Cervical back: Normal range of motion and neck supple.  Skin:    General: Skin is warm and dry.  Neurological:     Mental Status: He is alert and oriented to person, place, and time.     ED Results / Procedures / Treatments   Labs (all labs ordered are listed, but only abnormal results are displayed) Results for orders placed or performed during the hospital encounter of 03/15/23  Lipase, blood  Result Value Ref Range   Lipase 22 11 - 51 U/L  Comprehensive metabolic panel  Result Value Ref Range   Sodium 139 135 - 145 mmol/L   Potassium 3.2 (L) 3.5 - 5.1 mmol/L   Chloride 106 98 - 111 mmol/L   CO2 24 22 - 32 mmol/L   Glucose, Bld 200 (H) 70 - 99  mg/dL   BUN 18 6 - 20 mg/dL   Creatinine, Ser 1.61 0.61 - 1.24 mg/dL   Calcium 8.3 (L) 8.9 - 10.3 mg/dL   Total Protein 7.0 6.5 - 8.1 g/dL   Albumin 3.7 3.5 - 5.0 g/dL   AST 28 15 - 41 U/L   ALT 30 0 - 44 U/L   Alkaline Phosphatase 77 38 - 126 U/L   Total Bilirubin 1.4 (H) 0.3 - 1.2 mg/dL   GFR, Estimated >09 >60 mL/min   Anion gap 9 5 - 15  CBC  Result Value Ref Range   WBC 7.8 4.0 - 10.5 K/uL   RBC 4.53 4.22 - 5.81 MIL/uL   Hemoglobin 12.8 (L) 13.0 - 17.0 g/dL   HCT 45.4 (L) 09.8 - 11.9 %   MCV 83.0 80.0 - 100.0 fL   MCH 28.3 26.0 - 34.0 pg   MCHC 34.0 30.0 - 36.0 g/dL   RDW 14.7 82.9 - 56.2 %   Platelets 224 150 - 400 K/uL   nRBC 0.0 0.0 - 0.2 %  Urinalysis, Routine w reflex microscopic -Urine, Clean Catch  Result Value Ref Range   Color, Urine AMBER (A) YELLOW   APPearance CLEAR CLEAR   Specific Gravity, Urine >=1.030 1.005 - 1.030   pH 5.5 5.0 - 8.0   Glucose, UA 100 (A) NEGATIVE mg/dL   Hgb urine dipstick TRACE (A) NEGATIVE   Bilirubin Urine NEGATIVE NEGATIVE   Ketones, ur NEGATIVE NEGATIVE mg/dL   Protein, ur 130 (A) NEGATIVE mg/dL   Nitrite NEGATIVE NEGATIVE   Leukocytes,Ua NEGATIVE NEGATIVE  Urinalysis, Microscopic (reflex)  Result Value Ref Range   RBC / HPF 0-5 0 - 5 RBC/hpf   WBC, UA NONE SEEN 0 - 5 WBC/hpf   Bacteria, UA RARE (A) NONE SEEN   Squamous Epithelial / HPF 0-5 0 - 5 /HPF   Mucus PRESENT    CT ABDOMEN PELVIS W CONTRAST  Result Date: 03/16/2023 CLINICAL DATA:  Blunt poly trauma, right abdominal pain, vomiting. EXAM: CT ABDOMEN AND PELVIS WITH CONTRAST TECHNIQUE: Multidetector CT imaging of the abdomen and pelvis was performed using the standard protocol following bolus administration of intravenous contrast. RADIATION DOSE REDUCTION: This exam was performed according to the departmental dose-optimization program which includes automated exposure control, adjustment of the mA and/or kV according to patient size and/or use of iterative reconstruction  technique. CONTRAST:  OMNIPAQUE IOHEXOL 300 MG/ML  SOLN COMPARISON:  03/11/2023 FINDINGS: Lower chest: No acute abnormality. Hepatobiliary: No focal  liver abnormality is seen. No gallstones, gallbladder wall thickening, or biliary dilatation. Pancreas: Subtotal pancreatectomy. Unremarkable appearance of the residual head and uncinate process. Spleen: Unremarkable Adrenals/Urinary Tract: The adrenal glands are unremarkable. The kidneys are normal in size and position. Several previously identified right lower pole calculi have migrated into the proximal right ureter. There are now 3 serial obstructing calculi within the proximal right ureter at the level of the L5 vertebral body measuring up to 10 x 7 mm in dimension. There has developed moderate right hydronephrosis and mild perinephric stranding. Two 5-6 mm nonobstructing calculi noted within the right kidney. No hydronephrosis on the left. No intrarenal or ureteral calculi on the left. The bladder is unremarkable. Stomach/Bowel: Stomach is within normal limits. Appendix appears normal. No evidence of bowel wall thickening, distention, or inflammatory changes. Vascular/Lymphatic: Aortic atherosclerosis. No enlarged abdominal or pelvic lymph nodes. Reproductive: Prostate is unremarkable. Other: Stable 3.1 x 3.5 cm encapsulated region of fat necrosis, possibly the sequela of remote omental infarction or postsurgical change within the anterior right mid abdomen. Tiny fat containing umbilical hernia. Musculoskeletal: No acute bone abnormality. L3-4 lumbar fusion with instrumentation and L3 posterior decompression again noted. No lytic or blastic bone lesion. Degenerative changes are seen within the visualized thoracolumbar spine. IMPRESSION: 1. Interval migration of several previously identified right lower pole calculi into the proximal right ureter. There are now 3 serial obstructing calculi within the proximal right ureter at the level of the L5 vertebral body  measuring up to 10 x 7 mm in dimension. There has developed moderate right hydronephrosis and mild perinephric stranding. 2. Superimposed mild nonobstructing right nephrolithiasis. 3. Subtotal pancreatectomy. Aortic Atherosclerosis (ICD10-I70.0). Electronically Signed   By: Helyn Numbers M.D.   On: 03/16/2023 00:45    Radiology CT ABDOMEN PELVIS W CONTRAST  Result Date: 03/16/2023 CLINICAL DATA:  Blunt poly trauma, right abdominal pain, vomiting. EXAM: CT ABDOMEN AND PELVIS WITH CONTRAST TECHNIQUE: Multidetector CT imaging of the abdomen and pelvis was performed using the standard protocol following bolus administration of intravenous contrast. RADIATION DOSE REDUCTION: This exam was performed according to the departmental dose-optimization program which includes automated exposure control, adjustment of the mA and/or kV according to patient size and/or use of iterative reconstruction technique. CONTRAST:  OMNIPAQUE IOHEXOL 300 MG/ML  SOLN COMPARISON:  03/11/2023 FINDINGS: Lower chest: No acute abnormality. Hepatobiliary: No focal liver abnormality is seen. No gallstones, gallbladder wall thickening, or biliary dilatation. Pancreas: Subtotal pancreatectomy. Unremarkable appearance of the residual head and uncinate process. Spleen: Unremarkable Adrenals/Urinary Tract: The adrenal glands are unremarkable. The kidneys are normal in size and position. Several previously identified right lower pole calculi have migrated into the proximal right ureter. There are now 3 serial obstructing calculi within the proximal right ureter at the level of the L5 vertebral body measuring up to 10 x 7 mm in dimension. There has developed moderate right hydronephrosis and mild perinephric stranding. Two 5-6 mm nonobstructing calculi noted within the right kidney. No hydronephrosis on the left. No intrarenal or ureteral calculi on the left. The bladder is unremarkable. Stomach/Bowel: Stomach is within normal limits. Appendix  appears normal. No evidence of bowel wall thickening, distention, or inflammatory changes. Vascular/Lymphatic: Aortic atherosclerosis. No enlarged abdominal or pelvic lymph nodes. Reproductive: Prostate is unremarkable. Other: Stable 3.1 x 3.5 cm encapsulated region of fat necrosis, possibly the sequela of remote omental infarction or postsurgical change within the anterior right mid abdomen. Tiny fat containing umbilical hernia. Musculoskeletal: No acute bone abnormality. L3-4 lumbar  fusion with instrumentation and L3 posterior decompression again noted. No lytic or blastic bone lesion. Degenerative changes are seen within the visualized thoracolumbar spine. IMPRESSION: 1. Interval migration of several previously identified right lower pole calculi into the proximal right ureter. There are now 3 serial obstructing calculi within the proximal right ureter at the level of the L5 vertebral body measuring up to 10 x 7 mm in dimension. There has developed moderate right hydronephrosis and mild perinephric stranding. 2. Superimposed mild nonobstructing right nephrolithiasis. 3. Subtotal pancreatectomy. Aortic Atherosclerosis (ICD10-I70.0). Electronically Signed   By: Helyn Numbers M.D.   On: 03/16/2023 00:45    Procedures Procedures    Medications Ordered in ED Medications  iohexol (OMNIPAQUE) 300 MG/ML solution 125 mL (125 mLs Intravenous Contrast Given 03/15/23 2334)  ondansetron (ZOFRAN) injection 4 mg (4 mg Intravenous Given 03/16/23 0055)  sodium chloride 0.9 % bolus 1,000 mL (1,000 mLs Intravenous New Bag/Given 03/16/23 0053)  ketorolac (TORADOL) 30 MG/ML injection 15 mg (15 mg Intravenous Given 03/16/23 0055)    ED Course/ Medical Decision Making/ A&P                                 Medical Decision Making Patient is here with 4 days of diarrhea   Amount and/or Complexity of Data Reviewed Labs: ordered.    Details: Normal lipase 22, normal sodium 139, potassium slight low 3.2, normal creatinine  .81, normal LFTs. Normal white count 7.8, normal hemoglobine, normal platelets Radiology: ordered and independent interpretation performed.    Details: No SBO on CT by me   Risk Prescription drug management. Risk Details: Patient presents with diarrhea.  Incidental finding of kidney stones.  Patient states he is having a procedure for this later this week.  Feeling improved and anticipating discharge.  Stable for discharge with close follow up.     oses Final diagnoses:  Diarrhea, unspecified type  Kidney stone   Return for intractable cough, coughing up blood, fevers > 100.4 unrelieved by medication, shortness of breath, intractable vomiting, chest pain, shortness of breath, weakness, numbness, changes in speech, facial asymmetry, abdominal pain, passing out, Inability to tolerate liquids or food, cough, altered mental status or any concerns. No signs of systemic illness or infection. The patient is nontoxic-appearing on exam and vital signs are within normal limits.  I have reviewed the triage vital signs and the nursing notes. Pertinent labs & imaging results that were available during my care of the patient were reviewed by me and considered in my medical decision making (see chart for details). After history, exam, and medical workup I feel the patient has been appropriately medically screened and is safe for discharge home. Pertinent diagnoses were discussed with the patient. Patient was given return precautions. Rx / DC Orders ED Discharge Orders          Ordered    ondansetron (ZOFRAN-ODT) 8 MG disintegrating tablet        03/16/23 0051    tamsulosin (FLOMAX) 0.4 MG CAPS capsule  Daily        03/16/23 0051              Andersyn Fragoso, MD 03/16/23 1610

## 2023-03-16 NOTE — ED Provider Notes (Signed)
Informed by nurse patient is upset about and has made comments about staff wearing masks and other political comments.     Zenia Guest, MD 03/16/23 0347

## 2023-05-14 ENCOUNTER — Other Ambulatory Visit: Payer: Self-pay

## 2023-05-14 ENCOUNTER — Emergency Department (HOSPITAL_BASED_OUTPATIENT_CLINIC_OR_DEPARTMENT_OTHER)
Admission: EM | Admit: 2023-05-14 | Discharge: 2023-05-14 | Disposition: A | Discharge status: Home / Self Care | Payer: Federal, State, Local not specified - PPO | Attending: Emergency Medicine | Admitting: Emergency Medicine

## 2023-05-14 ENCOUNTER — Encounter (HOSPITAL_BASED_OUTPATIENT_CLINIC_OR_DEPARTMENT_OTHER): Payer: Self-pay

## 2023-05-14 DIAGNOSIS — L03116 Cellulitis of left lower limb: Secondary | ICD-10-CM | POA: Diagnosis not present

## 2023-05-14 DIAGNOSIS — M79605 Pain in left leg: Secondary | ICD-10-CM | POA: Diagnosis present

## 2023-05-14 DIAGNOSIS — L0390 Cellulitis, unspecified: Secondary | ICD-10-CM

## 2023-05-14 MED ORDER — CEPHALEXIN 250 MG PO CAPS
500.0000 mg | ORAL_CAPSULE | Freq: Once | ORAL | Status: AC
  Administered 2023-05-14: 500 mg via ORAL
  Filled 2023-05-14: qty 2

## 2023-05-14 MED ORDER — CEPHALEXIN 500 MG PO CAPS: 500.0000 mg | ORAL_CAPSULE | Freq: Two times a day (BID) | ORAL | 0 refills | Status: DC

## 2023-05-14 NOTE — ED Provider Notes (Signed)
[place] [facility] Provider Note   CSN: [id number] Arrival date & time: 05/14/23  1920     History Chief Complaint  Patient presents with   Leg Pain    HPI [name] [name] is a 57 y.o. male presenting for chief complaint of left-sided shin redness and swelling he has a patch of erythema taking approximately 30% of his shin.  States he got a new cat who has been scratching his leg frequently.  Denies fevers chills nausea vomiting shortness of breath.  Pain is not present until he pushes on the skin..   Patient's recorded medical, surgical, social, medication list and allergies were reviewed in the Snapshot window as part of the initial history.   Review of Systems   Review of Systems  Constitutional:  Negative for chills and fever.  HENT:  Negative for ear pain and sore throat.   Eyes:  Negative for pain and visual disturbance.  Respiratory:  Negative for cough and shortness of breath.   Cardiovascular:  Positive for leg swelling. Negative for chest pain and palpitations.  Gastrointestinal:  Negative for abdominal pain and vomiting.  Genitourinary:  Negative for dysuria and hematuria.  Musculoskeletal:  Negative for arthralgias and back pain.  Skin:  Negative for color change and rash.  Neurological:  Negative for seizures and syncope.  All other systems reviewed and are negative.   Physical Exam Updated Vital Signs BP (!) 150/73 (BP Location: Left Arm)   Pulse 88   Temp 98.7 F (37.1 C) (Oral)   Resp 18   Ht 5\' 8"  (1.727 m)   Wt 130.6 kg   SpO2 96%   BMI 43.79 kg/m  Physical Exam Vitals and nursing note reviewed.  Constitutional:      General: He is not in acute distress.    Appearance: He is well-developed.  HENT:     Head: Normocephalic and atraumatic.  Eyes:     Conjunctiva/sclera: Conjunctivae normal.  Cardiovascular:     Rate and Rhythm: Normal rate and regular rhythm.  Pulmonary:     Effort: Pulmonary effort is  normal. No respiratory distress.  Abdominal:     General: Abdomen is flat. There is no distension.  Musculoskeletal:        General: No swelling or deformity.     Comments: Very small amount of left-sided edema.  Both legs are mildly edematous.  No obvious vascular engorgement.  Palpable pulses.  No pitting edema.  He does have a patch of erythema approximately 7 cm in totality.    Skin:    General: Skin is warm and dry.     Capillary Refill: Capillary refill takes less than 2 seconds.  Neurological:     Mental Status: He is alert and oriented to person, place, and time. Mental status is at baseline.      ED Course/ Medical Decision Making/ A&P    Procedures Procedures   Medications Ordered in ED Medications  cephALEXin (KEFLEX) capsule 500 mg (has no administration in time range)    Medical Decision Making:   Patient's presentation is more consistent with cellulitis rather than deep venous thrombosis. Unfortunately ultrasound studies were not available at this time a night at this facility.  Will not anticoagulate patient given the very low pretest probability at this time.  Patient could proceed to a tertiary care center to get an ultrasound study should he wish but given the pretest probability being relatively low I believe patient can safely be  treated with antibiotics and observed at this time.  Will treat for cellulitis recommend follow-up with PCP within 72 hours.  He is welcome to return during business hours for ultrasound study should he have interval worsening.  Disposition:  I have considered need for hospitalization, however, considering all of the above, I believe this patient is stable for discharge at this time.  Patient/family educated about specific return precautions for given chief complaint and symptoms.  Patient/family educated about follow-up with PCP.     Patient/family expressed understanding of return precautions and need for follow-up. Patient spoken to  regarding all imaging and laboratory results and appropriate follow up for these results. All education provided in verbal form with additional information in written form. Time was allowed for answering of patient questions. Patient discharged.    Emergency Department Medication Summary:   Medications  cephALEXin (KEFLEX) capsule 500 mg (has no administration in time range)         Clinical Impression:  1. Left leg pain   2. Cellulitis, unspecified cellulitis site      Discharge   Final Clinical Impression(s) / ED Diagnoses Final diagnoses:  Left leg pain  Cellulitis, unspecified cellulitis site    Rx / DC Orders ED Discharge Orders          Ordered    cephALEXin (KEFLEX) 500 MG capsule  2 times daily        05/14/23 1944              Glyn Ade, MD 05/14/23 1944

## 2023-05-14 NOTE — ED Triage Notes (Signed)
Left leg swelling and redness first noticed yesterday around midnight.   Presented to UC and was encouraged to ED for DVT study.   Also sts he has 6 cats at home and unsure if one of them scratched him.

## 2023-05-14 NOTE — ED Notes (Signed)
D/c paperwork reviewed with pt, including prescriptions and follow up care.  All questions and/or concerns addressed at time of d/c.  No further needs expressed. . Pt verbalized understanding, Ambulatory without assistance to ED exit, NAD.   

## 2023-08-18 ENCOUNTER — Ambulatory Visit (HOSPITAL_BASED_OUTPATIENT_CLINIC_OR_DEPARTMENT_OTHER)
Admit: 2023-08-18 | Discharge: 2023-08-18 | Disposition: A | Discharge status: Home / Self Care | Payer: Federal, State, Local not specified - PPO | Attending: Internal Medicine | Admitting: Radiology

## 2023-08-18 ENCOUNTER — Ambulatory Visit (HOSPITAL_BASED_OUTPATIENT_CLINIC_OR_DEPARTMENT_OTHER)
Admission: EM | Admit: 2023-08-18 | Discharge: 2023-08-18 | Disposition: A | Discharge status: Home / Self Care | Payer: Federal, State, Local not specified - PPO

## 2023-08-18 ENCOUNTER — Other Ambulatory Visit (HOSPITAL_BASED_OUTPATIENT_CLINIC_OR_DEPARTMENT_OTHER): Payer: Self-pay

## 2023-08-18 ENCOUNTER — Encounter (HOSPITAL_BASED_OUTPATIENT_CLINIC_OR_DEPARTMENT_OTHER): Payer: Self-pay | Admitting: Emergency Medicine

## 2023-08-18 DIAGNOSIS — M79642 Pain in left hand: Secondary | ICD-10-CM | POA: Diagnosis not present

## 2023-08-18 DIAGNOSIS — M79645 Pain in left finger(s): Secondary | ICD-10-CM | POA: Diagnosis not present

## 2023-08-18 MED ORDER — DICLOFENAC SODIUM 50 MG PO TBEC
50.0000 mg | DELAYED_RELEASE_TABLET | Freq: Two times a day (BID) | ORAL | 0 refills | Status: AC | PRN
  Filled 2023-08-18: qty 30, 15d supply, fill #0

## 2023-08-18 NOTE — ED Provider Notes (Signed)
[name]    CSN: [id number] Arrival date & time: 08/18/23  0947      History   Chief Complaint Chief Complaint  Patient presents with   thumb pain    HPI [name] [name] is a 58 y.o. male.   Here with complaint of left hand pain.  He reports that he has had hand pain off and on for some time like maybe a couple of months.  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  He reports that he mostly uses his left hand for work.  Denies any trauma or injury to the left hand.  He worries that it is a tendinitis.  He is on allopurinol for kidney stone prevention.  He has never been formally diagnosed with gout.  But he reports that 6 months ago he had significant great toe pain for several days with redness and tenderness and he thinks that was probably gout.  His hand hurts more if he has it down, so he keeps it up on his chest to reduce pain.  Acetaminophen or ibuprofen has helped the pain but it has recurred and is worse now.     Past Medical History:  Diagnosis Date   Allergic rhinitis    Asthma    GERD (gastroesophageal reflux disease)    Sleep apnea     Patient Active Problem List   Diagnosis Date Noted   Insomnia 11/16/2016   Low back pain radiating to left leg 03/19/2015   Acute bronchitis 09/25/2014   Obesity, morbid (HCC) 04/03/2014   GERD 12/02/2009   Seasonal and perennial allergic rhinitis 12/02/2007   Asthma, mild intermittent 12/02/2007   Obstructive sleep apnea 12/02/2007    History reviewed. No pertinent surgical history.     Home Medications    Prior to Admission medications   Medication Sig Start Date End Date Taking? Authorizing Provider  allopurinol (ZYLOPRIM) 100 MG tablet Take 100 mg by mouth. 10/26/14 08/18/23 Yes [provider]  carvedilol (COREG) 6.25 MG tablet Take 6.25 mg by mouth.   Yes [provider]  diclofenac (CATAFLAM) 50 MG tablet  Take 1 tablet (50 mg total) by mouth 2 (two) times daily as needed for up to 15 days (hand or wrist pain). 08/18/23 09/02/23 Yes Prescilla Sours, FNP  glipiZIDE (GLUCOTROL XL) 10 MG 24 hr tablet Take by mouth. 04/17/17  Yes [provider]  insulin degludec (TRESIBA) 100 UNIT/ML FlexTouch Pen Inject into the skin. 09/04/22  Yes [provider]  metFORMIN (GLUCOPHAGE-XR) 500 MG 24 hr tablet Take 500 mg by mouth. 11/09/16 08/18/23 Yes [provider]  TRULICITY 1.5 MG/0.5ML SOAJ Inject into the skin. 08/06/23  Yes [provider]  atorvastatin (LIPITOR) 40 MG tablet Take 40 mg by mouth daily.    [provider]  hydrochlorothiazide (HYDRODIURIL) 50 MG tablet Take 25 mg by mouth.    [provider]  losartan (COZAAR) 25 MG tablet Take 25 mg by mouth daily.    [provider]  Multiple Vitamin (MULTIVITAMIN) tablet Take 1 tablet by mouth daily.    [provider]  ondansetron (ZOFRAN-ODT) 8 MG disintegrating tablet 8mg  ODT q8 hours prn nausea 03/16/23   Palumbo, April, MD  pioglitazone (ACTOS) 45 MG tablet Take 45 mg by mouth daily.    [provider]  simvastatin (ZOCOR) 80 MG tablet Take 80 mg by mouth daily.  [provider]  tamsulosin (FLOMAX) 0.4 MG CAPS capsule Take 1 capsule (0.4 mg total) by mouth daily. 03/16/23   Palumbo, April, MD  traZODone (DESYREL) 50 MG tablet 1 or 2 at bedtime for sleep 11/16/16   Waymon Budge, MD    Family History Family History  Problem Relation Age of Onset   Asthma Mother     Social History Social History   Tobacco Use   Smoking status: Never   Smokeless tobacco: Never  Substance Use Topics   Alcohol use: Not Currently    Comment: "rare occasion"   Drug use: No     Allergies   Patient has no known allergies.   Review of Systems Review of Systems  Constitutional:  Negative for chills and fever.  HENT:  Negative for ear pain and sore throat.   Eyes:  Negative  for pain and visual disturbance.  Respiratory:  Negative for cough and shortness of breath.   Cardiovascular:  Negative for chest pain and palpitations.  Gastrointestinal:  Negative for abdominal pain and vomiting.  Genitourinary:  Negative for dysuria and hematuria.  Musculoskeletal:  Positive for arthralgias and joint swelling. Negative for back pain.  Skin:  Negative for color change and rash.  Neurological:  Negative for seizures and syncope.  All other systems reviewed and are negative.    Physical Exam Triage Vital Signs ED Triage Vitals  Encounter Vitals Group     BP 08/18/23 1053 (!) 163/82     Systolic BP Percentile --      Diastolic BP Percentile --      Pulse Rate 08/18/23 1053 71     Resp 08/18/23 1053 20     Temp 08/18/23 1053 97.6 F (36.4 C)     Temp Source 08/18/23 1053 Oral     SpO2 08/18/23 1053 97 %     Weight --      Height --      Head Circumference --      Peak Flow --      Pain Score 08/18/23 1050 4     Pain Loc --      Pain Education --      Exclude from Growth Chart --    No data found.  Updated Vital Signs BP (!) 163/82 (BP Location: Right Arm)   Pulse 71   Temp 97.6 F (36.4 C) (Oral)   Resp 20   SpO2 97%   Visual Acuity Right Eye Distance:   Left Eye Distance:   Bilateral Distance:    Right Eye Near:   Left Eye Near:    Bilateral Near:     Physical Exam Vitals and nursing note reviewed.  Constitutional:      General: He is not in acute distress.    Appearance: He is well-developed. He is morbidly obese. He is not ill-appearing or toxic-appearing.  HENT:     Head: Normocephalic and atraumatic.     Nose: Nose normal.     Mouth/Throat:     Lips: Pink.     Mouth: Mucous membranes are moist.  Eyes:     Conjunctiva/sclera: Conjunctivae normal.     Pupils: Pupils are equal, round, and reactive to light.  Cardiovascular:     Rate and Rhythm: Normal rate and regular rhythm.     Heart sounds: S1 normal and S2 normal. No murmur  heard. Pulmonary:     Effort: Pulmonary effort is normal. No respiratory distress.     Breath sounds: Normal  breath sounds. No decreased breath sounds, wheezing, rhonchi or rales.  Abdominal:     Palpations: Abdomen is soft.     Tenderness: There is no abdominal tenderness.  Musculoskeletal:        General: No swelling.     Cervical back: Neck supple.     Comments: Both hands with overall good range of motion.  Left hand with some decrease of flexion and extension due to pain.  Full range of motion at both wrists.  Some decreased range of motion of the left thumb.  Left hand is visibly swollen.  It might be slightly pink but no erythema no exudate.  Skin:    General: Skin is warm and dry.     Capillary Refill: Capillary refill takes less than 2 seconds.     Findings: No rash.  Neurological:     Mental Status: He is alert and oriented to person, place, and time.  Psychiatric:        Mood and Affect: Mood normal.      UC Treatments / Results  Labs (all labs ordered are listed, but only abnormal results are displayed) Labs Reviewed - No data to display   EKG   Radiology No results found.  Procedures Procedures (including critical care time)  Medications Ordered in UC Medications - No data to display  Initial Impression / Assessment and Plan / UC Course  I have reviewed the triage vital signs and the nursing notes.  Pertinent labs & imaging results that were available during my care of the patient were reviewed by me and considered in my medical decision making (see chart for details).  Unable to get blood work.  Had plan to get a CBC with differential, CMP, uric acid, rheumatoid factor, sed rate, ANA.  Patient advised that he should hydrate and see his PCP for further workup if needed.  Left hand and wrist film may show some early arthritis but no acute fractures.  Patient has diabetes and is on multiple diabetic medications.  Discussed use of prednisone but given his  diabetes will not use prednisone.  Diclofenac, 50 mg, every 12 hours, as needed for pain.  Elevate left hand and warm compresses for comfort.  Follow-up if symptoms do not improve, worsen or new symptoms occur.  He would do well to see his PCP and get a further workup for probable gout. Final Clinical Impressions(s) / UC Diagnoses   Final diagnoses:  Left hand pain  Pain of left thumb     Discharge Instructions      Intent to get blood work but after 2 attempts we were not able to get blood from the patient.  Left wrist and hand film appeared negative for fractures but shows some arthritis.  Reviewed with patient that possible diagnoses are osteoarthritis, tendinitis, arthritic gout.  The patient has diabetes and is on multiple diabetic medications.  Will not use prednisone or steroids at this time due to his diabetes.  Encouraged him to be sure he is taking his allopurinol daily.  Diclofenac, 50 mg, twice daily after food, as needed for hand and wrist pain.  Elevate hand and try warm compresses.  Follow-up with PCP or orthopedics as needed.  Return here if symptoms do not improve, worsen or new symptoms occur.     ED Prescriptions     Medication Sig Dispense Auth. Provider   diclofenac (CATAFLAM) 50 MG tablet Take 1 tablet (50 mg total) by mouth 2 (two) times daily as needed  for up to 15 days (hand or wrist pain). 30 tablet Prescilla Sours, FNP      PDMP not reviewed this encounter.   Prescilla Sours, FNP 08/18/23 1154

## 2023-08-18 NOTE — Discharge Instructions (Addendum)
Intent to get blood work but after 2 attempts we were not able to get blood from the patient.  Left wrist and hand film appeared negative for fractures but shows some arthritis.  Reviewed with patient that possible diagnoses are osteoarthritis, tendinitis, arthritic gout.  The patient has diabetes and is on multiple diabetic medications.  Will not use prednisone or steroids at this time due to his diabetes.  Encouraged him to be sure he is taking his allopurinol daily.  Diclofenac, 50 mg, twice daily after food, as needed for hand and wrist pain.  Elevate hand and try warm compresses.  Follow-up with PCP or orthopedics as needed.  Return here if symptoms do not improve, worsen or new symptoms occur.

## 2023-08-18 NOTE — ED Triage Notes (Signed)
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

## 2024-04-06 ENCOUNTER — Other Ambulatory Visit (HOSPITAL_BASED_OUTPATIENT_CLINIC_OR_DEPARTMENT_OTHER): Payer: Self-pay | Admitting: Family Medicine

## 2024-04-06 DIAGNOSIS — M25562 Pain in left knee: Secondary | ICD-10-CM

## 2024-04-06 DIAGNOSIS — M25571 Pain in right ankle and joints of right foot: Secondary | ICD-10-CM

## 2024-04-20 ENCOUNTER — Ambulatory Visit (HOSPITAL_BASED_OUTPATIENT_CLINIC_OR_DEPARTMENT_OTHER)
Admission: RE | Admit: 2024-04-20 | Discharge: 2024-04-20 | Disposition: A | Discharge status: Home / Self Care | Source: Ambulatory Visit | Attending: Family Medicine | Admitting: Family Medicine

## 2024-04-20 DIAGNOSIS — M25571 Pain in right ankle and joints of right foot: Secondary | ICD-10-CM

## 2024-04-20 DIAGNOSIS — M25562 Pain in left knee: Secondary | ICD-10-CM

## 2024-05-18 ENCOUNTER — Ambulatory Visit (HOSPITAL_BASED_OUTPATIENT_CLINIC_OR_DEPARTMENT_OTHER)
Admission: EM | Admit: 2024-05-18 | Discharge: 2024-05-18 | Disposition: A | Discharge status: Home / Self Care | Attending: Family Medicine | Admitting: Family Medicine

## 2024-05-18 ENCOUNTER — Encounter (HOSPITAL_BASED_OUTPATIENT_CLINIC_OR_DEPARTMENT_OTHER): Payer: Self-pay | Admitting: Emergency Medicine

## 2024-05-18 ENCOUNTER — Other Ambulatory Visit (HOSPITAL_BASED_OUTPATIENT_CLINIC_OR_DEPARTMENT_OTHER): Payer: Self-pay

## 2024-05-18 DIAGNOSIS — W5501XA Bitten by cat, initial encounter: Secondary | ICD-10-CM

## 2024-05-18 DIAGNOSIS — S61451A Open bite of right hand, initial encounter: Secondary | ICD-10-CM

## 2024-05-18 DIAGNOSIS — Z23 Encounter for immunization: Secondary | ICD-10-CM | POA: Diagnosis not present

## 2024-05-18 MED ORDER — TETANUS-DIPHTH-ACELL PERTUSSIS 5-2-15.5 LF-MCG/0.5 IM SUSP
0.5000 mL | Freq: Once | INTRAMUSCULAR | Status: AC
  Administered 2024-05-18: 0.5 mL via INTRAMUSCULAR

## 2024-05-18 MED ORDER — AMOXICILLIN-POT CLAVULANATE 875-125 MG PO TABS
1.0000 | ORAL_TABLET | Freq: Two times a day (BID) | ORAL | 0 refills | Status: AC
Start: 2024-05-18 — End: 2024-05-28
  Filled 2024-05-18: qty 20, 10d supply, fill #0
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
## 2024-05-18 NOTE — Discharge Instructions (Signed)
 Take the antibiotics as prescribed for infection. Tetanus updated today. If you change your mind about the rabies vaccines please follow-up. Keep the areas clean Follow-up for worsening problems
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
